# Patient Record
Sex: Female | Born: 1960 | Race: Black or African American | Hispanic: No
Health system: Southern US, Community
[De-identification: ages and names within clinical notes are randomized; demographics above are authoritative.]

## PROBLEM LIST (undated history)

## (undated) DIAGNOSIS — I10 Essential (primary) hypertension: Secondary | ICD-10-CM

## (undated) DIAGNOSIS — J45909 Unspecified asthma, uncomplicated: Secondary | ICD-10-CM

## (undated) HISTORY — PX: HERNIA REPAIR: SHX51

---

## 1998-06-23 ENCOUNTER — Emergency Department (HOSPITAL_COMMUNITY): Admission: EM | Admit: 1998-06-23 | Discharge: 1998-06-23 | Payer: Self-pay | Admitting: Emergency Medicine

## 1999-01-18 ENCOUNTER — Emergency Department (HOSPITAL_COMMUNITY): Admission: EM | Admit: 1999-01-18 | Discharge: 1999-01-18 | Payer: Self-pay | Admitting: Emergency Medicine

## 1999-07-02 ENCOUNTER — Inpatient Hospital Stay (HOSPITAL_COMMUNITY): Admission: EM | Admit: 1999-07-02 | Discharge: 1999-07-11 | Payer: Self-pay | Admitting: Emergency Medicine

## 1999-07-02 ENCOUNTER — Encounter: Payer: Self-pay | Admitting: Emergency Medicine

## 1999-07-22 ENCOUNTER — Other Ambulatory Visit: Admission: RE | Admit: 1999-07-22 | Discharge: 1999-07-22 | Payer: Self-pay | Admitting: Obstetrics and Gynecology

## 1999-09-22 ENCOUNTER — Encounter (INDEPENDENT_AMBULATORY_CARE_PROVIDER_SITE_OTHER): Payer: Self-pay | Admitting: *Deleted

## 1999-09-22 ENCOUNTER — Ambulatory Visit (HOSPITAL_BASED_OUTPATIENT_CLINIC_OR_DEPARTMENT_OTHER): Admission: RE | Admit: 1999-09-22 | Discharge: 1999-09-22 | Payer: Self-pay | Admitting: General Surgery

## 1999-11-02 ENCOUNTER — Encounter: Admission: RE | Admit: 1999-11-02 | Discharge: 1999-11-02 | Payer: Self-pay | Admitting: Obstetrics and Gynecology

## 1999-11-02 ENCOUNTER — Encounter: Payer: Self-pay | Admitting: Obstetrics and Gynecology

## 2000-03-20 ENCOUNTER — Other Ambulatory Visit: Admission: RE | Admit: 2000-03-20 | Discharge: 2000-03-20 | Payer: Self-pay | Admitting: Obstetrics and Gynecology

## 2000-03-20 ENCOUNTER — Encounter (INDEPENDENT_AMBULATORY_CARE_PROVIDER_SITE_OTHER): Payer: Self-pay | Admitting: Specialist

## 2001-04-10 ENCOUNTER — Encounter: Payer: Self-pay | Admitting: Emergency Medicine

## 2001-04-10 ENCOUNTER — Emergency Department (HOSPITAL_COMMUNITY): Admission: EM | Admit: 2001-04-10 | Discharge: 2001-04-10 | Payer: Self-pay | Admitting: Emergency Medicine

## 2001-10-18 ENCOUNTER — Emergency Department (HOSPITAL_COMMUNITY): Admission: EM | Admit: 2001-10-18 | Discharge: 2001-10-18 | Payer: Self-pay | Admitting: Emergency Medicine

## 2001-10-18 ENCOUNTER — Encounter: Payer: Self-pay | Admitting: Emergency Medicine

## 2002-05-06 ENCOUNTER — Encounter: Payer: Self-pay | Admitting: Emergency Medicine

## 2002-05-06 ENCOUNTER — Emergency Department (HOSPITAL_COMMUNITY): Admission: EM | Admit: 2002-05-06 | Discharge: 2002-05-06 | Payer: Self-pay | Admitting: Emergency Medicine

## 2004-09-06 ENCOUNTER — Ambulatory Visit: Payer: Self-pay | Admitting: Family Medicine

## 2004-09-20 ENCOUNTER — Ambulatory Visit: Payer: Self-pay | Admitting: Family Medicine

## 2004-09-20 ENCOUNTER — Ambulatory Visit: Payer: Self-pay | Admitting: *Deleted

## 2004-10-24 ENCOUNTER — Ambulatory Visit: Payer: Self-pay | Admitting: Family Medicine

## 2004-11-22 ENCOUNTER — Ambulatory Visit: Payer: Self-pay | Admitting: Family Medicine

## 2004-11-30 ENCOUNTER — Encounter: Admission: RE | Admit: 2004-11-30 | Discharge: 2004-11-30 | Payer: Self-pay | Admitting: Family Medicine

## 2005-03-06 ENCOUNTER — Ambulatory Visit: Payer: Self-pay | Admitting: Nurse Practitioner

## 2005-03-08 ENCOUNTER — Ambulatory Visit (HOSPITAL_COMMUNITY): Admission: RE | Admit: 2005-03-08 | Discharge: 2005-03-08 | Payer: Self-pay | Admitting: Internal Medicine

## 2005-03-31 ENCOUNTER — Ambulatory Visit: Payer: Self-pay | Admitting: Family Medicine

## 2005-04-26 ENCOUNTER — Ambulatory Visit: Payer: Self-pay | Admitting: Family Medicine

## 2006-01-22 ENCOUNTER — Ambulatory Visit: Payer: Self-pay | Admitting: Family Medicine

## 2006-06-19 ENCOUNTER — Encounter: Admission: RE | Admit: 2006-06-19 | Discharge: 2006-06-19 | Payer: Self-pay | Admitting: Obstetrics and Gynecology

## 2006-08-09 ENCOUNTER — Encounter: Admission: RE | Admit: 2006-08-09 | Discharge: 2006-08-09 | Payer: Self-pay | Admitting: Interventional Radiology

## 2006-10-16 ENCOUNTER — Ambulatory Visit (HOSPITAL_COMMUNITY): Admission: RE | Admit: 2006-10-16 | Discharge: 2006-10-16 | Payer: Self-pay | Admitting: Interventional Radiology

## 2006-10-18 ENCOUNTER — Ambulatory Visit (HOSPITAL_COMMUNITY): Admission: RE | Admit: 2006-10-18 | Discharge: 2006-10-19 | Payer: Self-pay | Admitting: Interventional Radiology

## 2006-10-23 ENCOUNTER — Encounter: Admission: RE | Admit: 2006-10-23 | Discharge: 2006-10-23 | Payer: Self-pay | Admitting: Interventional Radiology

## 2007-05-15 ENCOUNTER — Encounter: Admission: RE | Admit: 2007-05-15 | Discharge: 2007-05-15 | Payer: Self-pay | Admitting: Internal Medicine

## 2007-06-30 ENCOUNTER — Emergency Department (HOSPITAL_COMMUNITY): Admission: EM | Admit: 2007-06-30 | Discharge: 2007-06-30 | Payer: Self-pay | Admitting: Emergency Medicine

## 2007-07-18 ENCOUNTER — Ambulatory Visit (HOSPITAL_BASED_OUTPATIENT_CLINIC_OR_DEPARTMENT_OTHER): Admission: RE | Admit: 2007-07-18 | Discharge: 2007-07-18 | Payer: Self-pay | Admitting: General Surgery

## 2007-07-18 ENCOUNTER — Encounter (INDEPENDENT_AMBULATORY_CARE_PROVIDER_SITE_OTHER): Payer: Self-pay | Admitting: General Surgery

## 2009-02-16 ENCOUNTER — Encounter: Admission: RE | Admit: 2009-02-16 | Discharge: 2009-02-24 | Payer: Self-pay | Admitting: Internal Medicine

## 2010-10-15 ENCOUNTER — Encounter: Payer: Self-pay | Admitting: Interventional Radiology

## 2010-10-16 ENCOUNTER — Encounter: Payer: Self-pay | Admitting: Interventional Radiology

## 2010-10-16 ENCOUNTER — Encounter: Payer: Self-pay | Admitting: Internal Medicine

## 2010-10-17 ENCOUNTER — Encounter: Payer: Self-pay | Admitting: Internal Medicine

## 2010-11-27 ENCOUNTER — Ambulatory Visit (INDEPENDENT_AMBULATORY_CARE_PROVIDER_SITE_OTHER): Payer: Self-pay | Admitting: Family Medicine

## 2010-11-27 ENCOUNTER — Ambulatory Visit
Admission: RE | Admit: 2010-11-27 | Discharge: 2010-11-27 | Disposition: A | Discharge status: Home / Self Care | Payer: Self-pay | Source: Ambulatory Visit | Attending: Family Medicine | Admitting: Family Medicine

## 2010-11-27 ENCOUNTER — Other Ambulatory Visit: Payer: Self-pay | Admitting: Family Medicine

## 2010-11-27 ENCOUNTER — Encounter: Payer: Self-pay | Admitting: Family Medicine

## 2010-11-27 DIAGNOSIS — M79609 Pain in unspecified limb: Secondary | ICD-10-CM

## 2010-11-27 DIAGNOSIS — M7750 Other enthesopathy of unspecified foot: Secondary | ICD-10-CM

## 2010-11-27 DIAGNOSIS — M79672 Pain in left foot: Secondary | ICD-10-CM

## 2010-12-03 ENCOUNTER — Telehealth (INDEPENDENT_AMBULATORY_CARE_PROVIDER_SITE_OTHER): Payer: Self-pay

## 2010-12-06 NOTE — Progress Notes (Signed)
  Phone Note Outgoing Call   Call placed by: Linton Flemings RN,  December 03, 2010 4:07 PM Call placed to: Patient Summary of Call: called, no answer unable to leave message

## 2010-12-06 NOTE — Assessment & Plan Note (Signed)
Summary: foot/tmrm3)   Vital Signs:  Patient Profile:   50 Years Old Female CC:      foot problem Height:     65 inches Weight:      233 pounds O2 Sat:      97 % O2 treatment:    Room Air Temp:     97.7 degrees F oral Pulse rate:   77 / minute Resp:     20 per minute BP sitting:   137 / 86  (left arm) Cuff size:   regular  Vitals Entered By: Linton Flemings RN (November 27, 2010 12:06 PM)                  Updated Prior Medication List: No Medications Current Allergies: No known allergies History of Present Illness Chief Complaint: foot problem History of Present Illness:  Subjective:  Patient complains of onset of pain in her left lateral foot over the 5th toe about two weeks ago, worse with ambulation.  She recalls no injury.  No recent change in shoes.  No recent change in activities.  REVIEW OF SYSTEMS Constitutional Symptoms      Denies fever, chills, night sweats, weight loss, weight gain, and fatigue.  Eyes       Denies change in vision, eye pain, eye discharge, glasses, contact lenses, and eye surgery. Ear/Nose/Throat/Mouth       Denies hearing loss/aids, change in hearing, ear pain, ear discharge, dizziness, frequent runny nose, frequent nose bleeds, sinus problems, sore throat, hoarseness, and tooth pain or bleeding.  Respiratory       Denies dry cough, productive cough, wheezing, shortness of breath, asthma, bronchitis, and emphysema/COPD.  Cardiovascular       Denies murmurs, chest pain, and tires easily with exhertion.    Gastrointestinal       Denies stomach pain, nausea/vomiting, diarrhea, constipation, blood in bowel movements, and indigestion. Genitourniary       Denies painful urination, kidney stones, and loss of urinary control. Neurological       Denies paralysis, seizures, and fainting/blackouts. Musculoskeletal       Complains of muscle pain and joint stiffness.      Denies decreased range of motion, redness, swelling, muscle weakness, and  gout.  Skin       Denies bruising, unusual mles/lumps or sores, and hair/skin or nail changes.  Psych       Denies mood changes, temper/anger issues, anxiety/stress, speech problems, depression, and sleep problems. Other Comments: pain to left outter side of foot x2 week; unknown injury   Past History:  Past Medical History: Unremarkable  Past Surgical History: hernia repair fibroids removed  Family History: Family History Lung cancer Family History of Prostate CA 1st degree relative <50  Social History: no alcohol  non smoker no drugs   Objective:  No acute distress  Left foot:  No swelling, erythema, or warmth.  There is mild tenderness over the 5 MTP joint.  Note presence of bunion.  Distal neurovascular intact  X-ray left foot:  IMPRESSION: No acute osseous abnormality.  Hallux valgus and bunion. Assessment New Problems: METATARSALGIA (ICD-726.70) FOOT PAIN, LEFT (ICD-729.5)   Plan New Orders: T-DG Foot Complete*L* [73630] New Patient Level III [16109] Planning Comments:   Recommend well-fitting shoes with arch supports. Begin NSAID.  Consider follow-up with podiatrist.  ([facility] information and instruction patient handout given on metatarsalgia)   The patient and/or caregiver has been counseled thoroughly with regard to medications prescribed including dosage, schedule, interactions,  rationale for use, and possible side effects and they verbalize understanding.  Diagnoses and expected course of recovery discussed and will return if not improved as expected or if the condition worsens. Patient and/or caregiver verbalized understanding.   Orders Added: 1)  T-DG Foot Complete*L* [73630] 2)  New Patient Level III [54098]

## 2010-12-18 ENCOUNTER — Emergency Department (HOSPITAL_COMMUNITY)
Admission: EM | Admit: 2010-12-18 | Discharge: 2010-12-18 | Disposition: A | Discharge status: Home / Self Care | Payer: Self-pay | Attending: Emergency Medicine | Admitting: Emergency Medicine

## 2010-12-18 ENCOUNTER — Emergency Department (HOSPITAL_COMMUNITY): Payer: Self-pay

## 2010-12-18 DIAGNOSIS — J45909 Unspecified asthma, uncomplicated: Secondary | ICD-10-CM | POA: Insufficient documentation

## 2010-12-18 DIAGNOSIS — H5789 Other specified disorders of eye and adnexa: Secondary | ICD-10-CM | POA: Insufficient documentation

## 2010-12-18 DIAGNOSIS — Y929 Unspecified place or not applicable: Secondary | ICD-10-CM | POA: Insufficient documentation

## 2010-12-18 DIAGNOSIS — H5710 Ocular pain, unspecified eye: Secondary | ICD-10-CM | POA: Insufficient documentation

## 2010-12-18 DIAGNOSIS — H1130 Conjunctival hemorrhage, unspecified eye: Secondary | ICD-10-CM | POA: Insufficient documentation

## 2010-12-18 DIAGNOSIS — S0010XA Contusion of unspecified eyelid and periocular area, initial encounter: Secondary | ICD-10-CM | POA: Insufficient documentation

## 2011-02-07 NOTE — Op Note (Signed)
[name], Jessica White                  ACCOUNT NO.:  [id number]   MEDICAL RECORD NO.:  [id number]          PATIENT TYPE:  AMB   LOCATION:  DSC                          FACILITY:  [facility]   PHYSICIAN:  Cherylynn Ridges, M.D.    DATE OF BIRTH:  [birth year]   DATE OF PROCEDURE:  07/18/2007  DATE OF DISCHARGE:                               OPERATIVE REPORT   PREOPERATIVE DIAGNOSIS:  Right breast intraductal papilloma with  draining nipple.   POSTOPERATIVE DIAGNOSIS:  Right breast intraductal papilloma with  draining nipple with a large subareolar cystic mass.   PROCEDURE:  1. Right breast ductal exploration.  2. Excision of a right subareolar ductal system with cystic mass.  3. Removal of papilloma.   SURGEON:  Cherylynn Ridges, M.D.   ASSISTANT:  None.   ANESTHESIA:  General with a laryngeal airway.   ESTIMATED BLOOD LOSS:  Less than 50 mL.   COMPLICATIONS:  None.   CONDITION:  Stable.   INDICATIONS FOR OPERATION:  The patient has a 50 year old with a  draining nipple on the right breast and a preoperative study outside  demonstrating an intraductal papilloma, who comes in for excision.   FINDINGS:  The patient had a large subareolar ductal system which was  dilated possibly because of partial obstruction secondary to the  intraductal mass with an intraductal papilloma noted at the distal  aspect of the duct on the right side.   OPERATION:  The patient was taken to the operating room and placed on  the table in a supine position.  After an adequate general laryngeal  airway anesthetic was administered, she was prepped and draped in the  usual sterile manner, exposing the right breast.   We cannulated the draining duct with a lacrimal probe.  We left that in  place as we made a curvilinear incision in the superolateral aspect of  the right areola.   We dissected down into the subcutaneous tissue into the subareolar area  using Metzenbaum scissors, the electrocautery and hemostat  clamps.  Using this technique, we were able to dissect out multiple subareolar  cystic masses and also dissect into the ductal system, demonstrating a  subareolar intraductal papilloma.   We excised the ductal mass and the intraductal papilloma in toto with  the nipple complex.  We opened the specimen, once we had gotten it off  the table and it demonstrated the papilloma, which was sent separately;  it was very friable and it attached itself somewhat from the ductal  system itself.  We sent the cystic mass separately and also after we had  resected the cystic mass and the intraductal papilloma, there appeared  to be nodularity underneath the nipple itself and we resected that,  which may have represented only connective tissue.   After all the masses were removed, we obtained hemostasis with  electrocautery.  We closed in 3 layers, a deep 3-0 Vicryl layer, a more  superficial subcutaneous 3-0 Vicryl layer and then the skin was closed  using a running subcuticular stitch of 4-0 Monocryl.  Marcaine 0.5%  without epinephrine was injected into the wound.  The nipple was  slightly inverted medially and inferiorly after the closure; however, we  were able to apply a sterile dressing including Dermabond, Steri-Strips  and Tegaderm.  All counts were correct.      Cherylynn Ridges, M.D.  Electronically Signed     JOW/MEDQ  D:  07/18/2007  T:  07/19/2007  Job:  [id number]

## 2011-02-10 NOTE — Op Note (Signed)
[facility]  Patient:    Jessica White                          MRN: [id number] Proc. Date: 09/22/99 Adm. Date:  [id number] Attending:  Fortino Sic                           Operative Report  PREOPERATIVE DIAGNOSIS:  Incarcerated epigastric hernia.  POSTOPERATIVE DIAGNOSIS:  Incarcerated epigastric hernia.  OPERATION PERFORMED:  Repair of incarcerated epigastric hernia.  SURGEON:  Marnee Spring. Wiliam Ke, M.D.  ASSISTANT:  None.  ANESTHESIA:  Endotracheal by hospital.  DESCRIPTION OF PROCEDURE:  Under good general anesthesia, the skin of the abdomen was prepped and draped in the usual manner.  The tender nodule was marked approximately 4.5 cm below the costal cartilages in the midline.  A vertical incision was made and deepened in subcutaneous tissue.  The defect in the fascia was found.  The incarcerated preperitoneal fat was clamped off and tied up with  2-0 silk.  The defect in the fascia was then closed with through-and-through musculofascial suture of #1 Novofil.  Hemostasis was good.  The rest of the anterior abdominal wall in the area was explored visually and digitally, and no  other defects were found.  The wound was closed with subcutaneous 3-0 Vicryl and subcuticular 4-0 Dexon.  Steri-Strips were applied.  Estimated blood loss minimal.  The patient received no blood and left the operating room in satisfactory condition after sponge and needle counts were verified. DD:  09/22/99 TD:  09/23/99 Job: [id number]

## 2011-07-05 LAB — POCT HEMOGLOBIN-HEMACUE: Hemoglobin: 11.7 — ABNORMAL LOW

## 2011-11-02 ENCOUNTER — Other Ambulatory Visit (HOSPITAL_COMMUNITY): Payer: Self-pay | Admitting: Nurse Practitioner

## 2011-11-02 ENCOUNTER — Ambulatory Visit (HOSPITAL_COMMUNITY)
Admission: RE | Admit: 2011-11-02 | Discharge: 2011-11-02 | Disposition: A | Discharge status: Home / Self Care | Payer: Self-pay | Source: Ambulatory Visit | Attending: Nurse Practitioner | Admitting: Nurse Practitioner

## 2011-11-02 DIAGNOSIS — IMO0002 Reserved for concepts with insufficient information to code with codable children: Secondary | ICD-10-CM | POA: Insufficient documentation

## 2011-11-02 DIAGNOSIS — R52 Pain, unspecified: Secondary | ICD-10-CM

## 2011-11-02 DIAGNOSIS — M25569 Pain in unspecified knee: Secondary | ICD-10-CM | POA: Insufficient documentation

## 2011-11-02 DIAGNOSIS — M1710 Unilateral primary osteoarthritis, unspecified knee: Secondary | ICD-10-CM | POA: Insufficient documentation

## 2013-03-15 IMAGING — CR DG KNEE 1-2V*L*
2 series · 2 of 2 positions shown · non-contrast
Comparison: None.

CLINICAL DATA: Knee pain and popping.

LEFT KNEE - 1-2 VIEW

[t knee ap left]
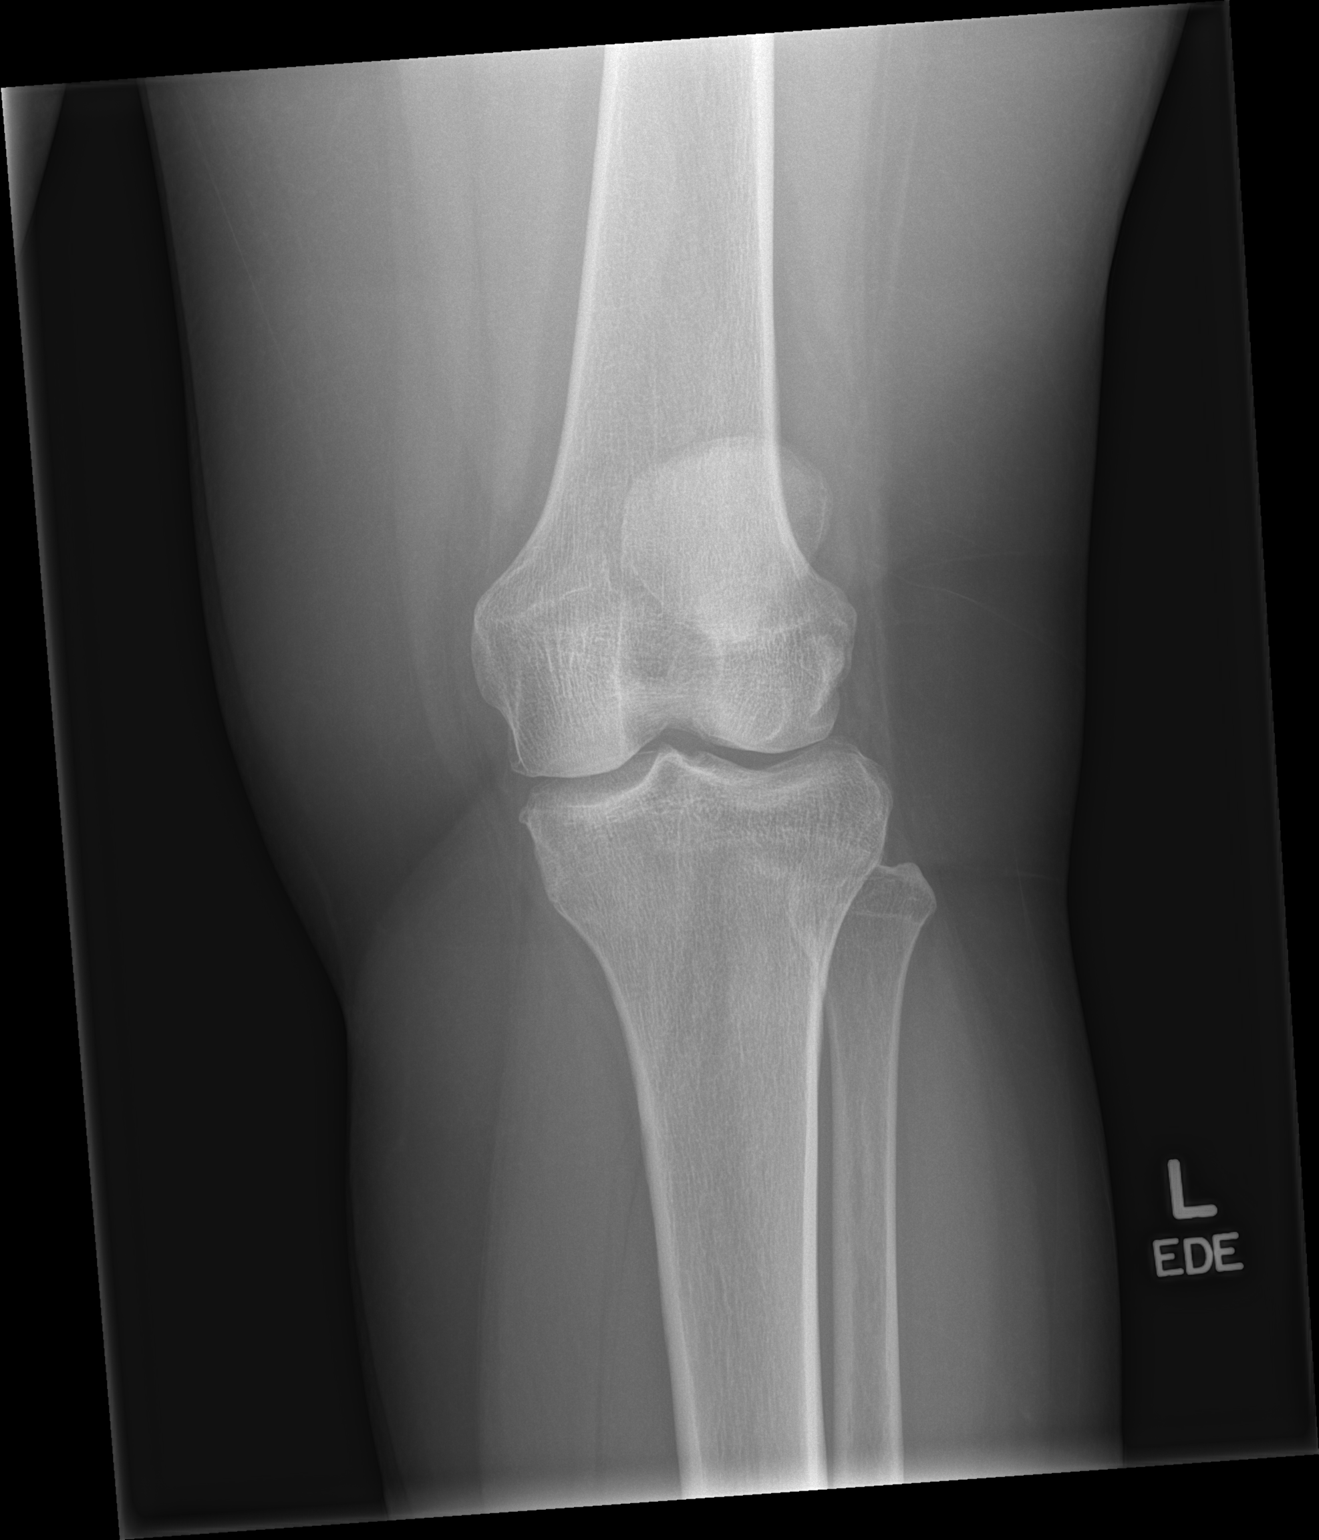

[t knee lat left]
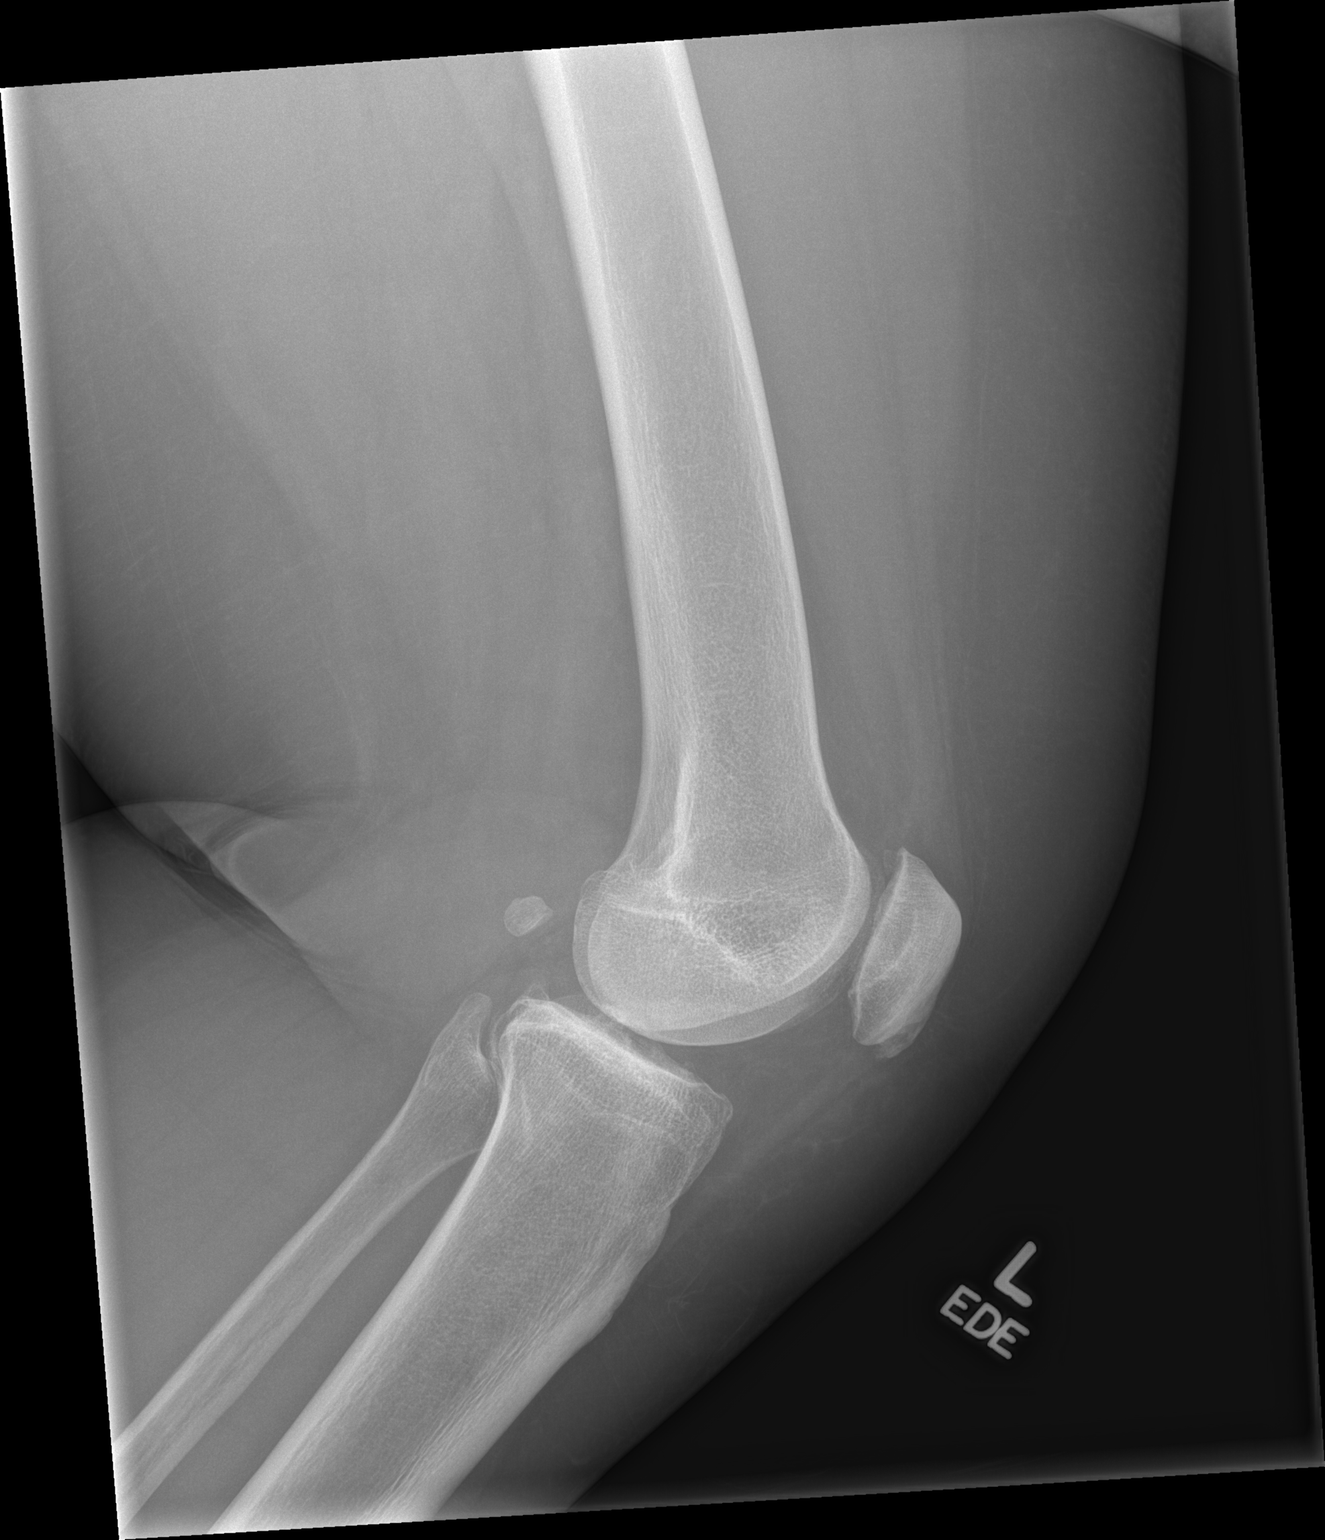

[2 of 2 positions shown; findings below may reference images not displayed]

FINDINGS: No acute bony or joint abnormality is identified.  No
joint effusion.  Small osteophytes about the knee are noted.
IMPRESSION: No acute finding.  Mild appearing degenerative disease.

## 2017-10-20 ENCOUNTER — Encounter (HOSPITAL_BASED_OUTPATIENT_CLINIC_OR_DEPARTMENT_OTHER): Payer: Self-pay | Admitting: *Deleted

## 2017-10-20 ENCOUNTER — Other Ambulatory Visit: Payer: Self-pay

## 2017-10-20 ENCOUNTER — Emergency Department (HOSPITAL_BASED_OUTPATIENT_CLINIC_OR_DEPARTMENT_OTHER)
Admission: EM | Admit: 2017-10-20 | Discharge: 2017-10-21 | Disposition: A | Discharge status: Home / Self Care | Payer: BLUE CROSS/BLUE SHIELD | Attending: Emergency Medicine | Admitting: Emergency Medicine

## 2017-10-20 DIAGNOSIS — R070 Pain in throat: Secondary | ICD-10-CM | POA: Insufficient documentation

## 2017-10-20 DIAGNOSIS — R05 Cough: Secondary | ICD-10-CM | POA: Insufficient documentation

## 2017-10-20 DIAGNOSIS — R067 Sneezing: Secondary | ICD-10-CM | POA: Diagnosis not present

## 2017-10-20 DIAGNOSIS — H9203 Otalgia, bilateral: Secondary | ICD-10-CM | POA: Insufficient documentation

## 2017-10-20 DIAGNOSIS — R0982 Postnasal drip: Secondary | ICD-10-CM | POA: Insufficient documentation

## 2017-10-20 DIAGNOSIS — J069 Acute upper respiratory infection, unspecified: Secondary | ICD-10-CM

## 2017-10-20 DIAGNOSIS — R030 Elevated blood-pressure reading, without diagnosis of hypertension: Secondary | ICD-10-CM | POA: Diagnosis not present

## 2017-10-20 DIAGNOSIS — J45909 Unspecified asthma, uncomplicated: Secondary | ICD-10-CM | POA: Insufficient documentation

## 2017-10-20 DIAGNOSIS — R0981 Nasal congestion: Secondary | ICD-10-CM | POA: Diagnosis present

## 2017-10-20 HISTORY — DX: Unspecified asthma, uncomplicated: J45.909

## 2017-10-20 MED ORDER — ACETAMINOPHEN 325 MG PO TABS: 650.0000 mg | ORAL_TABLET | Freq: Four times a day (QID) | ORAL | 0 refills | Status: DC | PRN

## 2017-10-20 MED ORDER — AEROCHAMBER PLUS FLO-VU LARGE MISC
1.0000 | Freq: Once | Status: AC
  Administered 2017-10-20: 1
  Filled 2017-10-20: qty 1

## 2017-10-20 MED ORDER — CETIRIZINE HCL 10 MG PO TABS: 10.0000 mg | ORAL_TABLET | Freq: Every day | ORAL | 1 refills | Status: DC

## 2017-10-20 MED ORDER — FLUTICASONE PROPIONATE 50 MCG/ACT NA SUSP: 2.0000 | Freq: Every day | NASAL | 0 refills | Status: DC

## 2017-10-20 MED ORDER — BENZONATATE 100 MG PO CAPS: 100.0000 mg | ORAL_CAPSULE | Freq: Three times a day (TID) | ORAL | 0 refills | Status: DC | PRN

## 2017-10-20 MED ORDER — ALBUTEROL SULFATE HFA 108 (90 BASE) MCG/ACT IN AERS
2.0000 | INHALATION_SPRAY | Freq: Once | RESPIRATORY_TRACT | Status: AC
  Administered 2017-10-20: 2 via RESPIRATORY_TRACT
  Filled 2017-10-20: qty 6.7

## 2017-10-20 NOTE — ED Notes (Signed)
Pt given d/c instructions as per chart. Rx x 4. Verbalizes understanding. No questions. 

## 2017-10-20 NOTE — ED Provider Notes (Signed)
[facility] EMERGENCY DEPARTMENT Provider Note   CSN: [id number] Arrival date & time: 10/20/17  1926     History   Chief Complaint Chief Complaint  Patient presents with  . Nasal Congestion    HPI Jessica White is a 57 y.o. female.  Jessica White is a 57 y.o. Female who presents to the emergency department complaining of URI symptoms for the past 3 days.  Patient reports sneezing, nasal congestion, postnasal drip, sore throat, cough and bilateral ear pressure.  She has had no fevers.  No shortness of breath.  She does reports she has asthma and sometimes feels tightness in her chest.  None currently.  She does not have an albuterol inhaler.  She took an unknown allergy pill today with out relief.  She denies fevers, trouble swallowing, hearing loss, facial swelling, rashes, neck stiffness, chest pain or shortness of breath.   The history is provided by the patient and medical records. No language interpreter was used.    Past Medical History:  Diagnosis Date  . Asthma     Patient Active Problem List   Diagnosis Date Noted  . METATARSALGIA 11/27/2010    Past Surgical History:  Procedure Laterality Date  . HERNIA REPAIR      OB History    No data available       Home Medications    Prior to Admission medications   Medication Sig Start Date End Date Taking? Authorizing Provider  acetaminophen (TYLENOL) 325 MG tablet Take 2 tablets (650 mg total) by mouth every 6 (six) hours as needed for mild pain, moderate pain, fever or headache. 10/20/17   Everlene Farrier, PA-C  benzonatate (TESSALON) 100 MG capsule Take 1 capsule (100 mg total) by mouth 3 (three) times daily as needed for cough. 10/20/17   Everlene Farrier, PA-C  cetirizine (ZYRTEC ALLERGY) 10 MG tablet Take 1 tablet (10 mg total) by mouth daily. 10/20/17   Everlene Farrier, PA-C  fluticasone (FLONASE) 50 MCG/ACT nasal spray Place 2 sprays into both nostrils daily. 10/20/17   Everlene Farrier, PA-C    Family  History No family history on file.  Social History Social History   Tobacco Use  . Smoking status: Never Smoker  . Smokeless tobacco: Never Used  Substance Use Topics  . Alcohol use: No    Frequency: Never  . Drug use: No     Allergies   Patient has no known allergies.   Review of Systems Review of Systems  Constitutional: Negative for chills and fever.  HENT: Positive for congestion, ear pain, postnasal drip, rhinorrhea, sinus pain, sneezing and sore throat. Negative for ear discharge, facial swelling, nosebleeds and trouble swallowing.   Eyes: Negative for visual disturbance.  Respiratory: Positive for cough. Negative for shortness of breath.   Cardiovascular: Negative for chest pain.  Gastrointestinal: Negative for abdominal pain and vomiting.  Musculoskeletal: Negative for neck pain and neck stiffness.  Skin: Negative for wound.  Neurological: Negative for syncope, light-headedness and headaches.     Physical Exam Updated Vital Signs BP (!) 159/99 (BP Location: Left Arm)   Pulse 83   Temp 97.9 F (36.6 C) (Oral)   Resp 18   Ht 5\' 5"  (1.651 m)   Wt 116.7 kg (257 lb 3.2 oz)   SpO2 100%   BMI 42.80 kg/m   Physical Exam  Constitutional: She appears well-developed and well-nourished. No distress.  Nontoxic-appearing.  HENT:  Head: Normocephalic and atraumatic.  Right Ear: External ear normal.  Left Ear: External ear normal.  Mouth/Throat: Oropharynx is clear and moist. No oropharyngeal exudate.  Rhinorrhea and boggy nasal turbinates bilaterally. Mild clear middle ear effusion bilaterally without TM erythema or loss of landmarks. Throat is clear.  Eyes: Conjunctivae are normal. Pupils are equal, round, and reactive to light. Right eye exhibits no discharge. Left eye exhibits no discharge.  Neck: Normal range of motion. Neck supple.  Cardiovascular: Normal rate, regular rhythm, normal heart sounds and intact distal pulses.  Pulmonary/Chest: Effort normal and  breath sounds normal. No stridor. No respiratory distress. She has no wheezes. She has no rales.  Lungs are clear to ascultation bilaterally. Symmetric chest expansion bilaterally. No increased work of breathing. No rales or rhonchi.    Abdominal: Soft. There is no tenderness.  Musculoskeletal: She exhibits no edema.  Lymphadenopathy:    She has no cervical adenopathy.  Neurological: She is alert. Coordination normal.  Skin: Skin is warm and dry. No rash noted. She is not diaphoretic. No erythema. No pallor.  Psychiatric: She has a normal mood and affect. Her behavior is normal.  Nursing note and vitals reviewed.    ED Treatments / Results  Labs (all labs ordered are listed, but only abnormal results are displayed) Labs Reviewed - No data to display  EKG  EKG Interpretation None       Radiology No results found.  Procedures Procedures (including critical care time)  Medications Ordered in ED Medications  albuterol (PROVENTIL HFA;VENTOLIN HFA) 108 (90 Base) MCG/ACT inhaler 2 puff (not administered)  AEROCHAMBER PLUS FLO-VU LARGE MISC 1 each (not administered)     Initial Impression / Assessment and Plan / ED Course  I have reviewed the triage vital signs and the nursing notes.  Pertinent labs & imaging results that were available during my care of the patient were reviewed by me and considered in my medical decision making (see chart for details).    This is a 57 y.o. Female who presents to the emergency department complaining of URI symptoms for the past 3 days.  Patient reports sneezing, nasal congestion, postnasal drip, sore throat, cough and bilateral ear pressure.  She has had no fevers.  No shortness of breath.  She does reports she has asthma and sometimes feels tightness in her chest.  None currently.  She does not have an albuterol inhaler.  On exam the patient is afebrile nontoxic-appearing.  Lungs are clear to auscultation bilaterally.  She is not hypoxic or  tachypneic.  Exam is consistent with a URI.  Will discharge with prescriptions for Flonase, Zyrtec, Tylenol and Tessalon Perles.  Patient also provided with albuterol inhaler as she is out of hers.  No wheezing noted on exam.  Blood pressure noted to be elevated.  Patient advised to follow-up with primary care to have blood pressure rechecked. I advised the patient to follow-up with their primary care provider this week. I advised the patient to return to the emergency department with new or worsening symptoms or new concerns. The patient verbalized understanding and agreement with plan.     Final Clinical Impressions(s) / ED Diagnoses   Final diagnoses:  Viral upper respiratory tract infection  Elevated blood pressure reading    ED Discharge Orders        Ordered    fluticasone (FLONASE) 50 MCG/ACT nasal spray  Daily     10/20/17 2333    cetirizine (ZYRTEC ALLERGY) 10 MG tablet  Daily     10/20/17 2333    acetaminophen (  TYLENOL) 325 MG tablet  Every 6 hours PRN     10/20/17 2333    benzonatate (TESSALON) 100 MG capsule  3 times daily PRN     10/20/17 2333       Everlene FarrierDansie, Sarai January, PA-C 10/20/17 2339    Azalia Bilisampos, Kevin, MD 10/21/17 [phone]

## 2017-10-20 NOTE — Discharge Instructions (Signed)
Please have your blood pressure rechecked by your doctor, it was high today at 159/99.

## 2017-10-20 NOTE — ED Notes (Signed)
O2 100%, HR 75, resp 22; NAD

## 2017-10-20 NOTE — ED Notes (Signed)
Pt states she has been congested since Thursday. Yellow nasal drainage. Productive cough with yellow sputum. BBS-Slightly diminished on left. States she also has asthma and feels tight in her chest at times.

## 2017-10-20 NOTE — ED Triage Notes (Signed)
Pt reports congestion and dark yellow drainage from nose x 2 days. Hx of asthma.

## 2017-10-20 NOTE — ED Notes (Signed)
ED Provider at bedside talking with pt 

## 2020-01-08 ENCOUNTER — Ambulatory Visit: Payer: Self-pay

## 2020-01-08 ENCOUNTER — Ambulatory Visit: Payer: Self-pay | Attending: Internal Medicine

## 2020-01-08 DIAGNOSIS — Z23 Encounter for immunization: Secondary | ICD-10-CM

## 2020-01-08 NOTE — Progress Notes (Signed)
   Covid-19 Vaccination Clinic  Name:  Jessica White    MRN: [id number] DOB: June 22, 1961  01/08/2020  Jessica White was observed post Covid-19 immunization for 15 minutes without incident. She was provided with Vaccine Information Sheet and instruction to access the V-Safe system.   Jessica White was instructed to call 911 with any severe reactions post vaccine: Marland Kitchen Difficulty breathing  . Swelling of face and throat  . A fast heartbeat  . A bad rash all over body  . Dizziness and weakness   Immunizations Administered    Name Date Dose VIS Date Route   Pfizer COVID-19 Vaccine 01/08/2020  8:28 AM 0.3 mL 09/05/2019 Intramuscular   Manufacturer: [employer], [employer]   Lot: [id number]   NDC: [address]-7

## 2020-02-03 ENCOUNTER — Ambulatory Visit: Payer: Self-pay | Attending: Internal Medicine

## 2020-02-03 DIAGNOSIS — Z23 Encounter for immunization: Secondary | ICD-10-CM

## 2020-02-03 NOTE — Progress Notes (Signed)
   Covid-19 Vaccination Clinic  Name:  Jessica White    MRN: [id number] DOB: [birth year]  02/03/2020  Jessica White was observed post Covid-19 immunization for 15 minutes without incident. She was provided with Vaccine Information Sheet and instruction to access the V-Safe system.   Jessica White was instructed to call 911 with any severe reactions post vaccine: Marland Kitchen Difficulty breathing  . Swelling of face and throat  . A fast heartbeat  . A bad rash all over body  . Dizziness and weakness   Immunizations Administered    Name Date Dose VIS Date Route   Pfizer COVID-19 Vaccine 02/03/2020  9:06 AM 0.3 mL 11/19/2018 Intramuscular   Manufacturer: [employer], [employer]   Lot: [id number]   NDC: [address]-8

## 2021-07-30 ENCOUNTER — Emergency Department (HOSPITAL_BASED_OUTPATIENT_CLINIC_OR_DEPARTMENT_OTHER)
Admission: EM | Admit: 2021-07-30 | Discharge: 2021-07-30 | Disposition: A | Discharge status: Home / Self Care | Payer: Self-pay | Attending: Emergency Medicine | Admitting: Emergency Medicine

## 2021-07-30 ENCOUNTER — Other Ambulatory Visit: Payer: Self-pay

## 2021-07-30 ENCOUNTER — Emergency Department (HOSPITAL_BASED_OUTPATIENT_CLINIC_OR_DEPARTMENT_OTHER): Payer: Self-pay

## 2021-07-30 ENCOUNTER — Encounter (HOSPITAL_BASED_OUTPATIENT_CLINIC_OR_DEPARTMENT_OTHER): Payer: Self-pay | Admitting: *Deleted

## 2021-07-30 DIAGNOSIS — M25512 Pain in left shoulder: Secondary | ICD-10-CM | POA: Insufficient documentation

## 2021-07-30 DIAGNOSIS — J45909 Unspecified asthma, uncomplicated: Secondary | ICD-10-CM | POA: Insufficient documentation

## 2021-07-30 LAB — CBC
HCT: 38.3 % (ref 36.0–46.0)
Hemoglobin: 12 g/dL (ref 12.0–15.0)
MCH: 25.9 pg — ABNORMAL LOW (ref 26.0–34.0)
MCHC: 31.3 g/dL (ref 30.0–36.0)
MCV: 82.5 fL (ref 80.0–100.0)
Platelets: 269 10*3/uL (ref 150–400)
RBC: 4.64 MIL/uL (ref 3.87–5.11)
RDW: 15.3 % (ref 11.5–15.5)
WBC: 7.5 10*3/uL (ref 4.0–10.5)
nRBC: 0 % (ref 0.0–0.2)

## 2021-07-30 LAB — BASIC METABOLIC PANEL
Anion gap: 8 (ref 5–15)
BUN: 24 mg/dL — ABNORMAL HIGH (ref 6–20)
CO2: 26 mmol/L (ref 22–32)
Calcium: 9.5 mg/dL (ref 8.9–10.3)
Chloride: 109 mmol/L (ref 98–111)
Creatinine, Ser: 1.08 mg/dL — ABNORMAL HIGH (ref 0.44–1.00)
GFR, Estimated: 59 mL/min — ABNORMAL LOW (ref >60)
Glucose, Bld: 84 mg/dL (ref 70–99)
Potassium: 3.7 mmol/L (ref 3.5–5.1)
Sodium: 143 mmol/L (ref 135–145)

## 2021-07-30 LAB — TROPONIN I (HIGH SENSITIVITY): Troponin I (High Sensitivity): 5 ng/L (ref <18)

## 2021-07-30 MED ORDER — OXYCODONE-ACETAMINOPHEN 5-325 MG PO TABS
1.0000 | ORAL_TABLET | Freq: Once | ORAL | Status: AC
  Administered 2021-07-30: 1 via ORAL
  Filled 2021-07-30: qty 1

## 2021-07-30 MED ORDER — ONDANSETRON 4 MG PO TBDP
8.0000 mg | ORAL_TABLET | Freq: Once | ORAL | Status: AC
  Administered 2021-07-30: 8 mg via ORAL
  Filled 2021-07-30: qty 2

## 2021-07-30 NOTE — ED Triage Notes (Signed)
Left shoulder pain radiating to her left upper arm for 2 weeks.  Denies any injuries.

## 2021-07-30 NOTE — ED Provider Notes (Signed)
[facility] EMERGENCY DEPT Provider Note   CSN: [id number] Arrival date & time: 07/30/21  1607     History Chief Complaint  Patient presents with   Shoulder Pain    Jessica White is a 60 y.o. female.   Shoulder Pain Associated symptoms: back pain   Associated symptoms: no fatigue and no fever    Patient presents with left shoulder and arm pain.  It started insidiously 2 weeks ago, the pain is constant.  She has tried Tylenol and sitting still to alleviate it.  It is aggravated with movement, but the pain is relatively constant and feels like numbness and tingling radiating from the shoulder down to her pinky.  Denies any chest pain or pressure, does endorse some back pain.  Denies any injury or trauma to the shoulder, no prior injuries or surgeries to the shoulder.  Patient does not use it actively, denies any overuse injuries at work.  Patient does not have any cardiac history, no history of MI or blood clots.    Past Medical History:  Diagnosis Date   Asthma     Patient Active Problem List   Diagnosis Date Noted   METATARSALGIA 11/27/2010    Past Surgical History:  Procedure Laterality Date   HERNIA REPAIR       OB History   No obstetric history on file.     No family history on file.  Social History   Tobacco Use   Smoking status: Never   Smokeless tobacco: Never  Vaping Use   Vaping Use: Never used  Substance Use Topics   Alcohol use: No   Drug use: No    Home Medications Prior to Admission medications   Not on File    Allergies    Patient has no known allergies.  Review of Systems   Review of Systems  Constitutional:  Negative for fatigue and fever.  Respiratory:  Negative for shortness of breath.   Cardiovascular:  Negative for chest pain and leg swelling.  Gastrointestinal:  Negative for abdominal pain, nausea and vomiting.  Musculoskeletal:  Positive for back pain and myalgias. Negative for arthralgias.  Neurological:   Negative for numbness and headaches.   Physical Exam Updated Vital Signs BP (!) 183/95   Pulse 95   Temp 98.2 F (36.8 C)   Resp 18   Ht 5\' 4"  (1.626 m)   Wt 99.8 kg   SpO2 96%   BMI 37.76 kg/m   Physical Exam Vitals and nursing note reviewed.  Constitutional:      General: She is not in acute distress.    Appearance: She is well-developed.     Comments: BMI 37.76  HENT:     Head: Normocephalic and atraumatic.  Eyes:     Conjunctiva/sclera: Conjunctivae normal.  Cardiovascular:     Rate and Rhythm: Normal rate and regular rhythm.     Pulses: Normal pulses.     Heart sounds: No murmur heard.    Comments: Radial pulse 2+ bilaterally Pulmonary:     Effort: Pulmonary effort is normal. No respiratory distress.     Breath sounds: Normal breath sounds.  Abdominal:     Palpations: Abdomen is soft.     Tenderness: There is no abdominal tenderness.  Musculoskeletal:        General: Tenderness present. Normal range of motion.     Cervical back: Neck supple.     Comments: Some tenderness to the posterior left shoulder around the scapula.  Full range  of motion, some subjective tenderness with exam.  Skin:    General: Skin is warm and dry.  Neurological:     Mental Status: She is alert.    ED Results / Procedures / Treatments   Labs (all labs ordered are listed, but only abnormal results are displayed) Labs Reviewed  BASIC METABOLIC PANEL  CBC  TROPONIN I (HIGH SENSITIVITY)    EKG None  Radiology No results found.  Procedures Procedures   Medications Ordered in ED Medications  oxyCODONE-acetaminophen (PERCOCET/ROXICET) 5-325 MG per tablet 1 tablet (has no administration in time range)  ondansetron (ZOFRAN-ODT) disintegrating tablet 8 mg (has no administration in time range)    ED Course  I have reviewed the triage vital signs and the nursing notes.  Pertinent labs & imaging results that were available during my care of the patient were reviewed by me and  considered in my medical decision making (see chart for details).    MDM Rules/Calculators/A&P                           She denies taking any medicine for high blood pressure, high cholesterol, diabetes.  She does not smoke cigarettes, overall low cardiac risk.  However, given the vague nature and onset of the left arm pain and the insidious nature I do think it is worthwhile to check an initial troponin to evaluate for ACS.  She has a risk factor of obesity and increased age, she also has high blood pressure in the ED setting and does not appear to get regular medical care.  I am concerned that the left arm pain could be an atypical presentation of ACS.  We will check an initial troponin, will also evaluate for musculoskeletal source of the shoulder pain with x-ray.  Chest pain work-up is reassuring.  Negative troponin, EKG has LVH but no acute ACS symptoms.  Patient does have an elevated creatinine and BUN, I discussed this with the patient advised her to follow-up with her PCP for recheck of lab values.  Radiograph of her shoulder does show a spur versus calcification.  Information orthopedic follow-up given.  Patient discharged in stable position.  Final Clinical Impression(s) / ED Diagnoses Final diagnoses:  None    Rx / DC Orders ED Discharge Orders     None        Theron Arista, Cordelia Poche 07/30/21 1914    Vanetta Mulders, MD 08/12/21 [phone]

## 2021-07-30 NOTE — Discharge Instructions (Signed)
Work-up for chest pain was negative.  He did have an elevation in creatinine which is a marker of kidney function.  I recommend following up with your primary care doctor about this and getting labs rechecked.  If you not have 1 information is provided above. The imaging of your shoulder did show a possible bony abnormality.  Take Tylenol ibuprofen as needed for it.  Follow-up with a primary care doctor or the orthopedic group as needed.  Return to the ED if things change or worsen.  Fragmented subacromial spur versus possible adjacent soft tissue  calcification, which can be seen with calcific tendinopathy.

## 2021-08-03 ENCOUNTER — Ambulatory Visit: Payer: Self-pay | Admitting: Family Medicine

## 2022-05-20 ENCOUNTER — Other Ambulatory Visit: Payer: Self-pay

## 2022-05-20 ENCOUNTER — Encounter (HOSPITAL_BASED_OUTPATIENT_CLINIC_OR_DEPARTMENT_OTHER): Payer: Self-pay

## 2022-05-20 ENCOUNTER — Emergency Department (HOSPITAL_BASED_OUTPATIENT_CLINIC_OR_DEPARTMENT_OTHER)
Admission: EM | Admit: 2022-05-20 | Discharge: 2022-05-20 | Disposition: A | Discharge status: Home / Self Care | Payer: Self-pay | Attending: Emergency Medicine | Admitting: Emergency Medicine

## 2022-05-20 DIAGNOSIS — J45909 Unspecified asthma, uncomplicated: Secondary | ICD-10-CM | POA: Insufficient documentation

## 2022-05-20 DIAGNOSIS — R600 Localized edema: Secondary | ICD-10-CM | POA: Insufficient documentation

## 2022-05-20 DIAGNOSIS — I10 Essential (primary) hypertension: Secondary | ICD-10-CM | POA: Insufficient documentation

## 2022-05-20 LAB — BASIC METABOLIC PANEL
Anion gap: 10 (ref 5–15)
BUN: 17 mg/dL (ref 8–23)
CO2: 26 mmol/L (ref 22–32)
Calcium: 9.7 mg/dL (ref 8.9–10.3)
Chloride: 106 mmol/L (ref 98–111)
Creatinine, Ser: 0.96 mg/dL (ref 0.44–1.00)
GFR, Estimated: >60 mL/min (ref >60)
Glucose, Bld: 109 mg/dL — ABNORMAL HIGH (ref 70–99)
Potassium: 4.2 mmol/L (ref 3.5–5.1)
Sodium: 142 mmol/L (ref 135–145)

## 2022-05-20 LAB — CBC WITH DIFFERENTIAL/PLATELET
Abs Immature Granulocytes: 0.04 10*3/uL (ref 0.00–0.07)
Basophils Absolute: 0 10*3/uL (ref 0.0–0.1)
Basophils Relative: 0 %
Eosinophils Absolute: 0.3 10*3/uL (ref 0.0–0.5)
Eosinophils Relative: 4 %
HCT: 38.8 % (ref 36.0–46.0)
Hemoglobin: 12.3 g/dL (ref 12.0–15.0)
Immature Granulocytes: 1 %
Lymphocytes Relative: 28 %
Lymphs Abs: 1.9 10*3/uL (ref 0.7–4.0)
MCH: 25.9 pg — ABNORMAL LOW (ref 26.0–34.0)
MCHC: 31.7 g/dL (ref 30.0–36.0)
MCV: 81.9 fL (ref 80.0–100.0)
Monocytes Absolute: 0.8 10*3/uL (ref 0.1–1.0)
Monocytes Relative: 12 %
Neutro Abs: 3.8 10*3/uL (ref 1.7–7.7)
Neutrophils Relative %: 55 %
Platelets: 254 10*3/uL (ref 150–400)
RBC: 4.74 MIL/uL (ref 3.87–5.11)
RDW: 15.6 % — ABNORMAL HIGH (ref 11.5–15.5)
WBC: 6.9 10*3/uL (ref 4.0–10.5)
nRBC: 0 % (ref 0.0–0.2)

## 2022-05-20 MED ORDER — AMLODIPINE BESYLATE 5 MG PO TABS: 5.0000 mg | ORAL_TABLET | Freq: Every day | ORAL | 1 refills | Status: DC

## 2022-05-20 MED ORDER — ACETAMINOPHEN 325 MG PO TABS
650.0000 mg | ORAL_TABLET | Freq: Once | ORAL | Status: AC
Start: 2022-05-20 — End: 2022-05-20
  Administered 2022-05-20: 650 mg via ORAL
  Filled 2022-05-20: qty 2

## 2022-05-20 MED ORDER — AMLODIPINE BESYLATE 5 MG PO TABS
5.0000 mg | ORAL_TABLET | Freq: Once | ORAL | Status: AC
Start: 2022-05-20 — End: 2022-05-20
  Administered 2022-05-20: 5 mg via ORAL
  Filled 2022-05-20: qty 1

## 2022-05-20 NOTE — ED Triage Notes (Signed)
Patient here POV from Home.  States she began to have a Headache at 1500 today. Friend assessed BP then and had Systolic readings above 194 Systolic.   No History of HTN. No Other Pain. No N/V/D.   NAD Noted during Triage. A&Ox4. GCS 15. Ambulatory.

## 2022-05-20 NOTE — ED Provider Notes (Signed)
[facility] EMERGENCY DEPT Provider Note  CSN: [id number] Arrival date & time: 05/20/22 1659  Chief Complaint(s) Hypertension  HPI Jessica White is a 61 y.o. female with history of asthma presenting to the emergency department with high blood pressure.  Patient reports that today she got a mild headache on the left side, her hairdresser suggested checking her blood pressure, it was elevated to 190 so she came to the emergency department.  She reports currently, her symptoms have improved.  She denies any nausea, vomiting, visual changes, numbness, tingling, weakness.  She denies any runny nose, sore throat, congestion.  Does not typically take blood pressure medications or check her home blood pressure.  Does not have a primary care physician.  Has noticed some mild leg swelling recently.  Denies any shortness of breath, chest pain, belly pain   Past Medical History Past Medical History:  Diagnosis Date   Asthma    Patient Active Problem List   Diagnosis Date Noted   METATARSALGIA 11/27/2010   Home Medication(s) Prior to Admission medications   Not on File                                                                                                                                    Past Surgical History Past Surgical History:  Procedure Laterality Date   HERNIA REPAIR     Family History No family history on file.  Social History Social History   Tobacco Use   Smoking status: Never   Smokeless tobacco: Never  Vaping Use   Vaping Use: Never used  Substance Use Topics   Alcohol use: No   Drug use: No   Allergies Patient has no known allergies.  Review of Systems Review of Systems  All other systems reviewed and are negative.   Physical Exam Vital Signs  I have reviewed the triage vital signs BP (!) 188/93   Pulse 74   Temp 98 F (36.7 C)   Resp 20   Ht 5\' 4"  (1.626 m)   Wt 99.8 kg   SpO2 100%   BMI 37.77 kg/m  Physical Exam Vitals and  nursing note reviewed.  Constitutional:      General: She is not in acute distress.    Appearance: She is well-developed.  HENT:     Head: Normocephalic and atraumatic.     Mouth/Throat:     Mouth: Mucous membranes are moist.  Eyes:     Pupils: Pupils are equal, round, and reactive to light.  Cardiovascular:     Rate and Rhythm: Normal rate and regular rhythm.     Heart sounds: No murmur heard. Pulmonary:     Effort: Pulmonary effort is normal. No respiratory distress.     Breath sounds: Normal breath sounds.  Abdominal:     General: Abdomen is flat.     Palpations: Abdomen is soft.     Tenderness: There is no  abdominal tenderness.  Musculoskeletal:        General: No tenderness.     Comments: Trace bilateral lower extremity edema  Skin:    General: Skin is warm and dry.  Neurological:     General: No focal deficit present.     Mental Status: She is alert. Mental status is at baseline.     Comments: Cranial nerves II through XII intact, strength 5 out of 5 in the bilateral upper and lower extremities, no sensory deficit to light touch, no dysmetria on finger-nose-finger testing, ambulatory with steady gait.   Psychiatric:        Mood and Affect: Mood normal.        Behavior: Behavior normal.     ED Results and Treatments Labs (all labs ordered are listed, but only abnormal results are displayed) Labs Reviewed  BASIC METABOLIC PANEL - Abnormal; Notable for the following components:      Result Value   Glucose, Bld 109 (*)    All other components within normal limits  CBC WITH DIFFERENTIAL/PLATELET - Abnormal; Notable for the following components:   MCH 25.9 (*)    RDW 15.6 (*)    All other components within normal limits                                                                                                                          Radiology No results found.  Pertinent labs & imaging results that were available during my care of the patient were reviewed  by me and considered in my medical decision making (see MDM for details).  Medications Ordered in ED Medications  acetaminophen (TYLENOL) tablet 650 mg (650 mg Oral Given 05/20/22 1847)  amLODipine (NORVASC) tablet 5 mg (5 mg Oral Given 05/20/22 1848)                                                                                                                                     Procedures Procedures  (including critical care time)  Medical Decision Making / ED Course   MDM:  61 year old female presenting to the emergency department with hypertension.  Overall well-appearing, exam unremarkable, neurologic exam reassuring.  Headache is significantly improved without intervention.  Given hypertension, will check basic labs as patient has had mild leg swelling, doubt hypertensive emergency, and no chest pain, vision changes, neurologic deficit, or other findings on exam.  We will treat hypertension with amlodipine.  Will reassess.  Clinical Course as of 05/20/22 1946  Sat May 20, 2022  1944 Labs reassuring. Headache resolved. Will start on amlodipine. Advised follow up with primary doctor. Will discharge patient to home. All questions answered. Patient comfortable with plan of discharge. Return precautions discussed with patient and specified on the after visit summary.  [WS]    Clinical Course User Index [WS] Lonell Grandchild, MD     Additional history obtained: -External records from outside source obtained and reviewed including: Chart review including previous notes, labs, imaging, consultation notes   Lab Tests: -I ordered, reviewed, and interpreted labs.   The pertinent results include:   Labs Reviewed  BASIC METABOLIC PANEL - Abnormal; Notable for the following components:      Result Value   Glucose, Bld 109 (*)    All other components within normal limits  CBC WITH DIFFERENTIAL/PLATELET - Abnormal; Notable for the following components:   MCH 25.9 (*)    RDW  15.6 (*)    All other components within normal limits      EKG   EKG Interpretation  Date/Time:  Saturday May 20 2022 17:32:41 EDT Ventricular Rate:  104 PR Interval:  146 QRS Duration: 82 QT Interval:  316 QTC Calculation: 415 R Axis:   8 Text Interpretation: Sinus tachycardia Possible Inferior infarct , age undetermined When compared with ECG of 30-Jul-2021 17:49, No significant change since last tracing Confirmed by Alvino Blood ([id number]) on 05/20/2022 7:46:21 PM         Medicines ordered and prescription drug management: Meds ordered this encounter  Medications   acetaminophen (TYLENOL) tablet 650 mg   amLODipine (NORVASC) tablet 5 mg    -I have reviewed the patients home medicines and have made adjustments as needed     Cardiac Monitoring: The patient was maintained on a cardiac monitor.  I personally viewed and interpreted the cardiac monitored which showed an underlying rhythm of: NSR    Reevaluation: After the interventions noted above, I reevaluated the patient and found that they have :resolved  Co morbidities that complicate the patient evaluation  Past Medical History:  Diagnosis Date   Asthma       Dispostion: Discharge     Final Clinical Impression(s) / ED Diagnoses Final diagnoses:  None     This chart was dictated using voice recognition software.  Despite best efforts to proofread,  errors can occur which can change the documentation meaning.    Lonell Grandchild, MD 05/20/22 1946

## 2022-05-26 ENCOUNTER — Ambulatory Visit
Admission: EM | Admit: 2022-05-26 | Discharge: 2022-05-26 | Disposition: A | Discharge status: Home / Self Care | Payer: Self-pay | Attending: Physician Assistant | Admitting: Physician Assistant

## 2022-05-26 DIAGNOSIS — R0981 Nasal congestion: Secondary | ICD-10-CM

## 2022-05-26 DIAGNOSIS — Z1152 Encounter for screening for COVID-19: Secondary | ICD-10-CM

## 2022-05-26 HISTORY — DX: Essential (primary) hypertension: I10

## 2022-05-26 MED ORDER — FLUTICASONE PROPIONATE 50 MCG/ACT NA SUSP: 1.0000 | Freq: Every day | NASAL | 0 refills | Status: AC

## 2022-05-26 NOTE — Discharge Instructions (Addendum)
  Please try Coricidin HBP for symptom relief. Nasal spray sent to pharmacy.   Follow up with any further concerns.

## 2022-05-26 NOTE — ED Provider Notes (Signed)
[facility]    CSN: [id number] Arrival date & time: 05/26/22  1431      History   Chief Complaint Chief Complaint  Patient presents with   Nasal Congestion    HPI Jessica White is a 61 y.o. female.   Patient here today for evaluation of nasal congestion that started this morning.  She reports she has had some associated cough as well.  She denies sore throat but has had some ear fullness.  She has not had fever.  She has taken over-the-counter medication without significant relief.  The history is provided by the patient.    Past Medical History:  Diagnosis Date   Asthma    Hypertension     Patient Active Problem List   Diagnosis Date Noted   METATARSALGIA 11/27/2010    Past Surgical History:  Procedure Laterality Date   HERNIA REPAIR      OB History   No obstetric history on file.      Home Medications    Prior to Admission medications   Medication Sig Start Date End Date Taking? Authorizing Provider  fluticasone (FLONASE) 50 MCG/ACT nasal spray Place 1 spray into both nostrils daily. 05/26/22  Yes Tomi Bamberger, PA-C  amLODipine (NORVASC) 5 MG tablet Take 1 tablet (5 mg total) by mouth daily. 05/20/22   Lonell Grandchild, MD    Family History History reviewed. No pertinent family history.  Social History Social History   Tobacco Use   Smoking status: Never   Smokeless tobacco: Never  Vaping Use   Vaping Use: Never used  Substance Use Topics   Alcohol use: No   Drug use: No     Allergies   Patient has no known allergies.   Review of Systems Review of Systems  Constitutional:  Negative for chills and fever.  HENT:  Positive for congestion. Negative for ear pain and sore throat.   Eyes:  Negative for discharge and redness.  Respiratory:  Positive for cough. Negative for shortness of breath and wheezing.   Gastrointestinal:  Negative for abdominal pain, diarrhea, nausea and vomiting.     Physical Exam Triage Vital  Signs ED Triage Vitals  Enc Vitals Group     BP 05/26/22 1556 (!) 143/103     Pulse Rate 05/26/22 1556 89     Resp 05/26/22 1556 16     Temp 05/26/22 1556 98.2 F (36.8 C)     Temp Source 05/26/22 1556 Oral     SpO2 05/26/22 1556 97 %     Weight --      Height --      Head Circumference --      Peak Flow --      Pain Score 05/26/22 1557 0     Pain Loc --      Pain Edu? --      Excl. in GC? --    No data found.  Updated Vital Signs BP (!) 143/103 (BP Location: Left Arm)   Pulse 89   Temp 98.2 F (36.8 C) (Oral)   Resp 16   SpO2 97%      Physical Exam Vitals and nursing note reviewed.  Constitutional:      General: She is not in acute distress.    Appearance: Normal appearance. She is not ill-appearing.  HENT:     Head: Normocephalic and atraumatic.     Right Ear: Tympanic membrane normal.     Left Ear: Tympanic membrane normal.  Nose: Congestion present.     Mouth/Throat:     Mouth: Mucous membranes are moist.     Pharynx: No oropharyngeal exudate or posterior oropharyngeal erythema.  Eyes:     Conjunctiva/sclera: Conjunctivae normal.  Cardiovascular:     Rate and Rhythm: Normal rate and regular rhythm.     Heart sounds: Normal heart sounds. No murmur heard. Pulmonary:     Effort: Pulmonary effort is normal. No respiratory distress.     Breath sounds: Normal breath sounds. No wheezing, rhonchi or rales.  Skin:    General: Skin is warm and dry.  Neurological:     Mental Status: She is alert.  Psychiatric:        Mood and Affect: Mood normal.        Thought Content: Thought content normal.      UC Treatments / Results  Labs (all labs ordered are listed, but only abnormal results are displayed) Labs Reviewed  SARS CORONAVIRUS 2 (TAT 6-24 HRS)    EKG   Radiology No results found.  Procedures Procedures (including critical care time)  Medications Ordered in UC Medications - No data to display  Initial Impression / Assessment and Plan / UC  Course  I have reviewed the triage vital signs and the nursing notes.  Pertinent labs & imaging results that were available during my care of the patient were reviewed by me and considered in my medical decision making (see chart for details).    Suspect viral etiology of symptoms and will screen for COVID.  Patient requested antibiotics to cover sinus infection but discussed with onset of symptoms today sinusitis unlikely.  Recommended follow-up if symptoms do not clear or worsen.  Recommended Coricidin HBP over-the-counter for treatment given known high blood pressure and Flonase prescribed.  Final Clinical Impressions(s) / UC Diagnoses   Final diagnoses:  Encounter for screening for COVID-19  Nasal congestion     Discharge Instructions       Please try Coricidin HBP for symptom relief. Nasal spray sent to pharmacy.   Follow up with any further concerns.      ED Prescriptions     Medication Sig Dispense Auth. Provider   fluticasone (FLONASE) 50 MCG/ACT nasal spray Place 1 spray into both nostrils daily. 15.8 mL Tomi Bamberger, PA-C      PDMP not reviewed this encounter.   Tomi Bamberger, PA-C 05/26/22 [phone]

## 2022-05-26 NOTE — ED Triage Notes (Signed)
Pt states nasal congestion since this morning.

## 2022-05-27 LAB — SARS CORONAVIRUS 2 (TAT 6-24 HRS): SARS Coronavirus 2: NEGATIVE

## 2023-01-07 ENCOUNTER — Other Ambulatory Visit: Payer: Self-pay

## 2023-01-07 ENCOUNTER — Encounter (HOSPITAL_BASED_OUTPATIENT_CLINIC_OR_DEPARTMENT_OTHER): Payer: Self-pay

## 2023-01-07 ENCOUNTER — Emergency Department (HOSPITAL_BASED_OUTPATIENT_CLINIC_OR_DEPARTMENT_OTHER)
Admission: EM | Admit: 2023-01-07 | Discharge: 2023-01-07 | Disposition: A | Discharge status: Home / Self Care | Payer: Self-pay | Attending: Emergency Medicine | Admitting: Emergency Medicine

## 2023-01-07 DIAGNOSIS — I10 Essential (primary) hypertension: Secondary | ICD-10-CM | POA: Insufficient documentation

## 2023-01-07 DIAGNOSIS — M25471 Effusion, right ankle: Secondary | ICD-10-CM | POA: Insufficient documentation

## 2023-01-07 LAB — BASIC METABOLIC PANEL
Anion gap: 12 (ref 5–15)
BUN: 15 mg/dL (ref 8–23)
CO2: 24 mmol/L (ref 22–32)
Calcium: 9.4 mg/dL (ref 8.9–10.3)
Chloride: 105 mmol/L (ref 98–111)
Creatinine, Ser: 1 mg/dL (ref 0.44–1.00)
GFR, Estimated: >60 mL/min (ref >60)
Glucose, Bld: 119 mg/dL — ABNORMAL HIGH (ref 70–99)
Potassium: 3.4 mmol/L — ABNORMAL LOW (ref 3.5–5.1)
Sodium: 141 mmol/L (ref 135–145)

## 2023-01-07 MED ORDER — AMLODIPINE BESYLATE 5 MG PO TABS
5.0000 mg | ORAL_TABLET | Freq: Once | ORAL | Status: AC
  Administered 2023-01-07: 5 mg via ORAL
  Filled 2023-01-07: qty 1

## 2023-01-07 MED ORDER — AMLODIPINE BESYLATE 5 MG PO TABS: 5.0000 mg | ORAL_TABLET | Freq: Every day | ORAL | 2 refills | Status: DC

## 2023-01-07 NOTE — ED Provider Notes (Signed)
[place] EMERGENCY DEPARTMENT AT [facility] Provider Note   CSN: [id number] Arrival date & time: 01/07/23  2013     History  Chief Complaint  Patient presents with   Hypertension    Jessica White is a 62 y.o. female.  Patient presents to the emergency department for evaluation of high blood pressure.  Patient states that yesterday she noted some swelling to the outside of her right ankle.  One of her friends told her that she should check her blood pressure and she found it to be elevated.  Patient has had a history of high blood pressure  and was and was prescribed amlodipine during emergency department visit last year, but she has failed to maintain compliance with the medication.  She denies associated chest pain or shortness of breath.  No new cough.  She denies injury of her ankle.  Otherwise she is feeling normal in her usual state of health.  No history of diabetes or high cholesterol.       Home Medications Prior to Admission medications   Medication Sig Start Date End Date Taking? Authorizing Provider  amLODipine (NORVASC) 5 MG tablet Take 1 tablet (5 mg total) by mouth daily. 05/20/22   Lonell Grandchild, MD  fluticasone (FLONASE) 50 MCG/ACT nasal spray Place 1 spray into both nostrils daily. 05/26/22   Tomi Bamberger, PA-C      Allergies    Patient has no known allergies.    Review of Systems   Review of Systems  Physical Exam Updated Vital Signs BP (!) 204/102 (BP Location: Right Arm)   Pulse (!) 102   Temp 98.7 F (37.1 C) (Oral)   Resp 18   Ht 5\' 5"  (1.651 m)   Wt 117.9 kg   SpO2 99%   BMI 43.27 kg/m   Physical Exam Vitals and nursing note reviewed.  Constitutional:      Appearance: She is well-developed.  HENT:     Head: Normocephalic and atraumatic.     Right Ear: External ear normal.     Left Ear: External ear normal.     Nose: Nose normal.  Eyes:     Conjunctiva/sclera: Conjunctivae normal.  Cardiovascular:     Rate and Rhythm:  Normal rate and regular rhythm.     Heart sounds: Murmur heard.  Pulmonary:     Effort: No respiratory distress.  Musculoskeletal:     Cervical back: Normal range of motion and neck supple.     Comments: Patient has some localized swelling over the lateral malleolus of the right ankle, but no significant calf tenderness or swelling bilaterally.  She is able to range her ankle well without pain.  No signs of cellulitis.  Skin:    General: Skin is warm and dry.  Neurological:     Mental Status: She is alert.     ED Results / Procedures / Treatments   Labs (all labs ordered are listed, but only abnormal results are displayed) Labs Reviewed  BASIC METABOLIC PANEL - Abnormal; Notable for the following components:      Result Value   Potassium 3.4 (*)    Glucose, Bld 119 (*)    All other components within normal limits    EKG None  Radiology No results found.  Procedures Procedures    Medications Ordered in ED Medications - No data to display  ED Course/ Medical Decision Making/ A&P    Patient seen and examined. History obtained directly from patient.  Will  check BMP and plan to initiate antihypertensives.  Labs/EKG: Ordered BMP.  Imaging: None ordered, consider chest x-ray with patient without significant signs of CHF.  Medications/Fluids: None ordered  Most recent vital signs reviewed and are as follows: BP (!) 204/102 (BP Location: Right Arm)   Pulse (!) 102   Temp 98.7 F (37.1 C) (Oral)   Resp 18   Ht  (1.651 m)   Wt 117.9 kg   SpO2 99%   BMI 43.27 kg/m   Initial impression: Hypertension, right ankle swelling.  9:56 PM Reassessment performed. Patient appears stable.  Last blood pressure was 199/81.  Patient received 5 mg amlodipine during reevaluation.  Labs personally reviewed and interpreted including: BMP with slightly elevated blood sugar and minimally low potassium at 3.4.  Reviewed pertinent lab work and imaging with patient at bedside.  Questions answered.   Most current vital signs reviewed and are as follows: BP (!) 199/81   Pulse 90   Temp 98.7 F (37.1 C) (Oral)   Resp 18   Ht  (1.651 m)   Wt 117.9 kg   SpO2 100%   BMI 43.27 kg/m   Plan: Discharge to home.   Prescriptions written for: Amlodipine  Other home care instructions discussed: Blood pressure monitoring, elevation of leg  ED return instructions discussed: Worsening or uncontrolled symptoms  Follow-up instructions discussed: Patient encouraged to follow-up with their PCP in 7 days.                                Medical Decision Making Amount and/or Complexity of Data Reviewed Labs: ordered.   Patient here with uncontrolled hypertension.  She has ankle swelling of undetermined etiology.  Do not suspect DVT, CHF, cellulitis, etc.  Likely unrelated to current hypertension.  Patient has had elevated blood pressures in the past.  She will be restarted today on amlodipine.  No signs of endorgan damage. She states that she does have PCP follow-up.  Counseled on importance of blood pressure control and likely need for medication adjustments and additions to get her blood pressure to goal, and that today's treatment is just a start of this.  The patient's vital signs, pertinent lab work and imaging were reviewed and interpreted as discussed in the ED course. Hospitalization was considered for further testing, treatments, or serial exams/observation. However as patient is well-appearing, has a stable exam, and reassuring studies today, I do not feel that they warrant admission at this time. This plan was discussed with the patient who verbalizes agreement and comfort with this plan and seems reliable and able to return to the Emergency Department with worsening or changing symptoms.          Final Clinical Impression(s) / ED Diagnoses Final diagnoses:  Uncontrolled hypertension    Rx / DC Orders ED Discharge Orders          Ordered     amLODipine (NORVASC) 5 MG tablet  Daily        01/07/23 2156              Renne Crigler, PA-C 01/07/23 2158    Glyn Ade, MD 01/07/23 [phone]

## 2023-01-07 NOTE — Discharge Instructions (Signed)
Please read and follow all provided instructions.  Your diagnoses today include:  1. Uncontrolled hypertension     Your blood pressure was high today (BP): BP (!) 199/81   Pulse 90   Temp 98.7 F (37.1 C) (Oral)   Resp 18   Ht 5\' 5"  (1.651 m)   Wt 117.9 kg   SpO2 100%   BMI 43.27 kg/m   Tests performed today include: Vital signs. See below for your results today.  Electrolytes and kidney function were normal  Medications prescribed:  Amlodipine: Oral medication for high blood pressure  Home care instructions:  Follow any educational materials contained in this packet.  Follow-up instructions: Please follow-up with your primary care provider in the next 7 days for a recheck of your symptoms and blood pressure.    Return instructions:  Please return to the Emergency Department if you experience worsening symptoms.  Return with severe chest pain, abdominal pain, or shortness of breath.  Return with severe headache, focal weakness, numbness, difficulty with speech or vision. Return with loss of vision or sudden vision change. Please return if you have any other emergent concerns.  Additional Information:  Your vital signs today were: BP (!) 199/81   Pulse 90   Temp 98.7 F (37.1 C) (Oral)   Resp 18   Ht 5\' 5"  (1.651 m)   Wt 117.9 kg   SpO2 100%   BMI 43.27 kg/m  If your blood pressure (BP) was elevated above 135/85 this visit, please have this repeated by your doctor within one month. --------------

## 2023-01-07 NOTE — ED Triage Notes (Signed)
Patient here POV from Home.  Endorses Right Knee and Ankle Swelling that began yesterday PM. Assessed BP and it was 232 Systolic.   No SOB or CP. No Headache. No Dizziness. Some Neck Discomfort. History of HTN and has not taken BP Medication in 2-3 Months. Informed then to take BP Medication or seek Assessment from PCP.   NAD Noted during Triage. A&Ox4. Gcs 15. Ambulatory.

## 2023-01-08 ENCOUNTER — Encounter: Payer: Self-pay | Admitting: *Deleted

## 2024-05-14 ENCOUNTER — Emergency Department (HOSPITAL_BASED_OUTPATIENT_CLINIC_OR_DEPARTMENT_OTHER)
Admission: EM | Admit: 2024-05-14 | Discharge: 2024-05-14 | Disposition: A | Discharge status: Home / Self Care | Payer: Self-pay

## 2024-05-14 ENCOUNTER — Other Ambulatory Visit: Payer: Self-pay

## 2024-05-14 DIAGNOSIS — J0100 Acute maxillary sinusitis, unspecified: Secondary | ICD-10-CM | POA: Insufficient documentation

## 2024-05-14 DIAGNOSIS — J45909 Unspecified asthma, uncomplicated: Secondary | ICD-10-CM | POA: Insufficient documentation

## 2024-05-14 DIAGNOSIS — Z79899 Other long term (current) drug therapy: Secondary | ICD-10-CM | POA: Insufficient documentation

## 2024-05-14 DIAGNOSIS — I10 Essential (primary) hypertension: Secondary | ICD-10-CM | POA: Insufficient documentation

## 2024-05-14 DIAGNOSIS — Z7951 Long term (current) use of inhaled steroids: Secondary | ICD-10-CM | POA: Insufficient documentation

## 2024-05-14 LAB — RESP PANEL BY RT-PCR (RSV, FLU A&B, COVID)  RVPGX2
Influenza A by PCR: NEGATIVE
Influenza B by PCR: NEGATIVE
Resp Syncytial Virus by PCR: NEGATIVE
SARS Coronavirus 2 by RT PCR: NEGATIVE

## 2024-05-14 MED ORDER — AMLODIPINE BESYLATE 5 MG PO TABS
5.0000 mg | ORAL_TABLET | Freq: Once | ORAL | Status: AC
  Administered 2024-05-14: 5 mg via ORAL
  Filled 2024-05-14: qty 1

## 2024-05-14 MED ORDER — AMOXICILLIN-POT CLAVULANATE 875-125 MG PO TABS
1.0000 | ORAL_TABLET | Freq: Two times a day (BID) | ORAL | 0 refills | Status: DC
Start: 2024-05-14 — End: 2024-05-14

## 2024-05-14 MED ORDER — OXYMETAZOLINE HCL 0.05 % NA SOLN: 1.0000 | Freq: Two times a day (BID) | NASAL | 0 refills | Status: AC

## 2024-05-14 MED ORDER — AMLODIPINE BESYLATE 5 MG PO TABS
5.0000 mg | ORAL_TABLET | Freq: Every day | ORAL | 0 refills | Status: AC
Start: 2024-05-14 — End: 2024-06-13

## 2024-05-14 MED ORDER — DIPHENHYDRAMINE HCL 25 MG PO CAPS
25.0000 mg | ORAL_CAPSULE | Freq: Once | ORAL | Status: AC
Start: 2024-05-14 — End: 2024-05-14
  Administered 2024-05-14: 25 mg via ORAL
  Filled 2024-05-14: qty 1

## 2024-05-14 MED ORDER — AMOXICILLIN-POT CLAVULANATE 875-125 MG PO TABS: 1.0000 | ORAL_TABLET | Freq: Two times a day (BID) | ORAL | 0 refills | Status: AC

## 2024-05-14 MED ORDER — ACETAMINOPHEN 500 MG PO TABS
1000.0000 mg | ORAL_TABLET | Freq: Once | ORAL | Status: AC
Start: 2024-05-14 — End: 2024-05-14
  Administered 2024-05-14: 1000 mg via ORAL
  Filled 2024-05-14: qty 2

## 2024-05-14 NOTE — ED Triage Notes (Signed)
 Reports sinus infection x 3 days. Denies fevers. Reports facial pain and watering eyes.

## 2024-05-14 NOTE — ED Provider Notes (Signed)
 [place] EMERGENCY DEPARTMENT AT [facility] Provider Note   CSN: [id number] Arrival date & time: 05/14/24  1308     Patient presents with: Facial Pain   Jessica White is a 63 y.o. female with PMHx asthma, HTN who presents to ED concerned for maxillofacial sinus pain x3 days with rhinorrhea and congestion as well. Patient had a dose of benadryl  yesterday, but has not taken any other OTC medications for her symptoms. Patient denies fever, sore throat, cough, nausea, vomiting, diarrhea. Patient denies sick contacts, but states that symptoms started after patient sat in a car without air conditioning a couple days ago. Patient states that she usually gets sinusitis twice a year when the seasons change.   Of note, patient also requesting for refill of BP medication. Patient has been out of this medication x2 weeks.   HPI     Prior to Admission medications   Medication Sig Start Date End Date Taking? Authorizing Provider  oxymetazoline  (AFRIN NASAL SPRAY) 0.05 % nasal spray Place 1 spray into both nostrils 2 (two) times daily. 05/14/24  Yes Hoy Fraction F, PA-C  amLODipine  (NORVASC ) 5 MG tablet Take 1 tablet (5 mg total) by mouth daily for 30 doses. 05/14/24 06/13/24  Hoy Fraction FALCON, PA-C  amoxicillin -clavulanate (AUGMENTIN ) 875-125 MG tablet Take 1 tablet by mouth every 12 (twelve) hours for 7 days. 05/16/24 05/23/24  Hoy Fraction FALCON, PA-C  fluticasone  (FLONASE ) 50 MCG/ACT nasal spray Place 1 spray into both nostrils daily. 05/26/22   Billy Asberry FALCON, PA-C    Allergies: Patient has no known allergies.    Review of Systems  HENT:  Positive for sinus pain.     Updated Vital Signs BP (!) 166/144 (BP Location: Right Arm)   Pulse (!) 106   Temp 99.6 F (37.6 C) (Oral)   Resp 20   SpO2 96%   Physical Exam Vitals and nursing note reviewed.  Constitutional:      General: She is not in acute distress.    Appearance: She is not ill-appearing or toxic-appearing.   HENT:     Head: Normocephalic and atraumatic.     Nose:     Comments: BL maxillary sinus tenderness to palpation    Mouth/Throat:     Mouth: Mucous membranes are moist.  Eyes:     General: No scleral icterus.       Right eye: No discharge.        Left eye: No discharge.     Conjunctiva/sclera: Conjunctivae normal.  Cardiovascular:     Rate and Rhythm: Normal rate and regular rhythm.     Pulses: Normal pulses.     Heart sounds: Normal heart sounds. No murmur heard. Pulmonary:     Effort: Pulmonary effort is normal. No respiratory distress.     Breath sounds: Normal breath sounds. No wheezing, rhonchi or rales.  Abdominal:     General: Abdomen is flat.  Musculoskeletal:     Left lower leg: Left lower leg edema: .sfmur.  Skin:    General: Skin is warm and dry.     Findings: No rash.  Neurological:     General: No focal deficit present.     Mental Status: She is alert and oriented to person, place, and time. Mental status is at baseline.  Psychiatric:        Mood and Affect: Mood normal.        Behavior: Behavior normal.     (all labs ordered are listed, but only  abnormal results are displayed) Labs Reviewed  RESP PANEL BY RT-PCR (RSV, FLU A&B, COVID)  RVPGX2    EKG: None  Radiology: No results found.   Procedures   Medications Ordered in the ED  acetaminophen  (TYLENOL ) tablet 1,000 mg (1,000 mg Oral Given 05/14/24 1513)  diphenhydrAMINE  (BENADRYL ) capsule 25 mg (25 mg Oral Given 05/14/24 1513)  amLODipine  (NORVASC ) tablet 5 mg (5 mg Oral Given 05/14/24 1513)                                    Medical Decision Making   This patient presents to the ED for concern of sinus pain, this involves an extensive number of treatment options, and is a complaint that carries with it a high risk of complications and morbidity.  The differential diagnosis includes Flu/COVID/RSV, sinusitis, pneumonia, meningitis.   Co morbidities that complicate the patient  evaluation  Asthma, HTN   Problem List / ED Course / Critical interventions / Medication management  Patient presents to ED concerned for BL maxillary sinus pain, congestion, and rhinorrhea.  Exam with BL maxillary sinus tenderness to palpation.  Rest of physical exam reassuring.  Patient afebrile with stable vitals. I Ordered, and personally interpreted labs.  Respiratory panel negative. All results with patient.  Answered all questions.  Patient was educated on alternating between 650 mg Tylenol  and 400 mg ibuprofen every 3 hours as needed for pain.  I will provide patient with Afrin and she understands to not use this medication for more than 3 days. I will prescribe patient ABX for if her sinus pain does not resolve by day 7. I also will prescribe patient her hypertensive medicines.  Patient understands the need to follow-up with her PCP. I have reviewed the patients home medicines and have made adjustments as needed The patient has been appropriately medically screened and/or stabilized in the ED. I have low suspicion for any other emergent medical condition which would require further screening, evaluation or treatment in the ED or require inpatient management. At time of discharge the patient is hemodynamically stable and in no acute distress. I have discussed work-up results and diagnosis with patient and answered all questions. Patient is agreeable with discharge plan. We discussed strict return precautions for returning to the emergency department and they verbalized understanding.      Social Determinants of Health:  none      Final diagnoses:  Acute non-recurrent maxillary sinusitis  Primary hypertension    ED Discharge Orders          Ordered    amoxicillin -clavulanate (AUGMENTIN ) 875-125 MG tablet  Every 12 hours        05/14/24 1512    amLODipine  (NORVASC ) 5 MG tablet  Daily        05/14/24 1510    oxymetazoline  (AFRIN NASAL SPRAY) 0.05 % nasal spray  2 times daily         05/14/24 1510    amoxicillin -clavulanate (AUGMENTIN ) 875-125 MG tablet  Every 12 hours,   Status:  Discontinued        05/14/24 1511               Hoy Nidia FALCON, [place] 05/14/24 1514    Ula Prentice SAUNDERS, MD 05/15/24 [phone]

## 2024-05-14 NOTE — Discharge Instructions (Addendum)
 As discussed, you will need to follow-up with your primary care provider.  Seek emergency care if experiencing any new or worsening symptoms.  Alternating between 650 mg Tylenol  and 400 mg Advil : The best way to alternate taking Acetaminophen  (example Tylenol ) and Ibuprofen  (example Advil /Motrin ) is to take them 3 hours apart. For example, if you take ibuprofen  at 6 am you can then take Tylenol  at 9 am. You can continue this regimen throughout the day, making sure you do not exceed the recommended maximum dose for each drug.

## 2024-06-12 ENCOUNTER — Other Ambulatory Visit: Payer: Self-pay

## 2024-06-12 ENCOUNTER — Emergency Department (HOSPITAL_BASED_OUTPATIENT_CLINIC_OR_DEPARTMENT_OTHER)
Admission: EM | Admit: 2024-06-12 | Discharge: 2024-06-12 | Disposition: A | Discharge status: Home / Self Care | Payer: Self-pay

## 2024-06-12 ENCOUNTER — Emergency Department (HOSPITAL_BASED_OUTPATIENT_CLINIC_OR_DEPARTMENT_OTHER): Payer: Self-pay | Admitting: Radiology

## 2024-06-12 DIAGNOSIS — J45909 Unspecified asthma, uncomplicated: Secondary | ICD-10-CM | POA: Insufficient documentation

## 2024-06-12 DIAGNOSIS — R0981 Nasal congestion: Secondary | ICD-10-CM | POA: Insufficient documentation

## 2024-06-12 DIAGNOSIS — R051 Acute cough: Secondary | ICD-10-CM | POA: Insufficient documentation

## 2024-06-12 LAB — CBC
HCT: 40.3 % (ref 36.0–46.0)
Hemoglobin: 12.9 g/dL (ref 12.0–15.0)
MCH: 26.2 pg (ref 26.0–34.0)
MCHC: 32 g/dL (ref 30.0–36.0)
MCV: 81.9 fL (ref 80.0–100.0)
Platelets: 301 K/uL (ref 150–400)
RBC: 4.92 MIL/uL (ref 3.87–5.11)
RDW: 14.6 % (ref 11.5–15.5)
WBC: 10.1 K/uL (ref 4.0–10.5)
nRBC: 0 % (ref 0.0–0.2)

## 2024-06-12 LAB — BASIC METABOLIC PANEL WITH GFR
Anion gap: 13 (ref 5–15)
BUN: 15 mg/dL (ref 8–23)
CO2: 23 mmol/L (ref 22–32)
Calcium: 9.5 mg/dL (ref 8.9–10.3)
Chloride: 105 mmol/L (ref 98–111)
Creatinine, Ser: 0.98 mg/dL (ref 0.44–1.00)
GFR, Estimated: >60 mL/min (ref >60)
Glucose, Bld: 135 mg/dL — ABNORMAL HIGH (ref 70–99)
Potassium: 4 mmol/L (ref 3.5–5.1)
Sodium: 141 mmol/L (ref 135–145)

## 2024-06-12 LAB — RESP PANEL BY RT-PCR (RSV, FLU A&B, COVID)  RVPGX2
Influenza A by PCR: NEGATIVE
Influenza B by PCR: NEGATIVE
Resp Syncytial Virus by PCR: NEGATIVE
SARS Coronavirus 2 by RT PCR: NEGATIVE

## 2024-06-12 MED ORDER — AMOXICILLIN-POT CLAVULANATE 875-125 MG PO TABS: 1.0000 | ORAL_TABLET | Freq: Two times a day (BID) | ORAL | 0 refills | Status: AC

## 2024-06-12 MED ORDER — IPRATROPIUM-ALBUTEROL 0.5-2.5 (3) MG/3ML IN SOLN: 3.0000 mL | Freq: Once | RESPIRATORY_TRACT | Status: DC

## 2024-06-12 MED ORDER — GUAIFENESIN 100 MG/5ML PO LIQD: 100.0000 mg | ORAL | 0 refills | Status: AC | PRN

## 2024-06-12 MED ORDER — AMOXICILLIN-POT CLAVULANATE 875-125 MG PO TABS: 1.0000 | ORAL_TABLET | Freq: Two times a day (BID) | ORAL | 0 refills | Status: DC

## 2024-06-12 MED ORDER — ALBUTEROL SULFATE HFA 108 (90 BASE) MCG/ACT IN AERS: 1.0000 | INHALATION_SPRAY | Freq: Four times a day (QID) | RESPIRATORY_TRACT | 0 refills | Status: AC | PRN

## 2024-06-12 NOTE — Discharge Instructions (Signed)
 As we discussed, your chest x-ray was normal.  No signs of pneumonia which is great news.  I will give you Augmentin  for your sinus infection.  I also prescribed you some cough medicine and albuterol  inhaler.  I would like for you to return to the emergency department for any worsening symptoms.  You can follow-up with your primary doctor.

## 2024-06-12 NOTE — ED Triage Notes (Signed)
 Pt POV reporting asthma exacerbation related to sinus infection, completed course of abx. Does not have inhaler or neb treatments at home, labored in triage.

## 2024-06-12 NOTE — ED Provider Notes (Signed)
 [place] EMERGENCY DEPARTMENT AT [facility] Provider Note   CSN: [id number] Arrival date & time: 06/12/24  1554     Patient presents with: Shortness of Breath   Jessica White is a 63 y.o. female with history of asthma who presents to the emergency department today for further evaluation of nasal congestion, sore throat, and productive cough.  She states that she is coughing up yellow-green sputum.  She denies any fever or chills.  Patient states that she is having a harder time with her asthma.  Does not have any nebulizer treatments or inhalers at home.  Denies any abdominal symptoms, nausea, vomiting, diarrhea.    Shortness of Breath      Prior to Admission medications   Medication Sig Start Date End Date Taking? Authorizing Provider  albuterol  (VENTOLIN  HFA) 108 (90 Base) MCG/ACT inhaler Inhale 1-2 puffs into the lungs every 6 (six) hours as needed for wheezing or shortness of breath. 06/12/24  Yes Theotis, Haylo Fake M, PA-C  guaiFENesin  (ROBITUSSIN) 100 MG/5ML liquid Take 5-10 mLs (100-200 mg total) by mouth every 4 (four) hours as needed for cough or to loosen phlegm. 06/12/24  Yes Nevayah Faust M, PA-C  amLODipine  (NORVASC ) 5 MG tablet Take 1 tablet (5 mg total) by mouth daily for 30 doses. 05/14/24 06/13/24  Hoy Nidia FALCON, PA-C  amoxicillin -clavulanate (AUGMENTIN ) 875-125 MG tablet Take 1 tablet by mouth every 12 (twelve) hours. 06/12/24   Theotis Peers M, PA-C  fluticasone  (FLONASE ) 50 MCG/ACT nasal spray Place 1 spray into both nostrils daily. 05/26/22   Billy Asberry FALCON, PA-C  oxymetazoline  (AFRIN NASAL SPRAY) 0.05 % nasal spray Place 1 spray into both nostrils 2 (two) times daily. 05/14/24   Hoy Nidia FALCON, PA-C    Allergies: Patient has no known allergies.    Review of Systems  Respiratory:  Positive for shortness of breath.   All other systems reviewed and are negative.   Updated Vital Signs BP (!) 147/115   Pulse 98   Temp 98.2 F (36.8 C) (Oral)    Resp 15   Ht 5' 4 (1.626 m)   Wt 117.9 kg   SpO2 100%   BMI 44.63 kg/m   Physical Exam Vitals and nursing note reviewed.  Constitutional:      General: She is not in acute distress.    Appearance: Normal appearance.  HENT:     Head: Normocephalic and atraumatic.  Eyes:     General:        Right eye: No discharge.        Left eye: No discharge.  Cardiovascular:     Comments: Regular rate and rhythm.  S1/S2 are distinct without any evidence of murmur, rubs, or gallops.  Radial pulses are 2+ bilaterally.  Dorsalis pedis pulses are 2+ bilaterally.  No evidence of pedal edema. Pulmonary:     Comments: Clear to auscultation bilaterally.  Normal effort.  No respiratory distress.  No evidence of wheezes, rales, or rhonchi heard throughout. Abdominal:     General: Abdomen is flat. Bowel sounds are normal. There is no distension.     Tenderness: There is no abdominal tenderness. There is no guarding or rebound.  Musculoskeletal:        General: Normal range of motion.     Cervical back: Neck supple.  Skin:    General: Skin is warm and dry.     Findings: No rash.  Neurological:     General: No focal deficit present.  Mental Status: She is alert.  Psychiatric:        Mood and Affect: Mood normal.        Behavior: Behavior normal.     (all labs ordered are listed, but only abnormal results are displayed) Labs Reviewed  BASIC METABOLIC PANEL WITH GFR - Abnormal; Notable for the following components:      Result Value   Glucose, Bld 135 (*)    All other components within normal limits  RESP PANEL BY RT-PCR (RSV, FLU A&B, COVID)  RVPGX2  CBC    EKG: EKG Interpretation Date/Time:  Thursday June 12 2024 16:02:32 EDT Ventricular Rate:  111 PR Interval:  138 QRS Duration:  85 QT Interval:  324 QTC Calculation: 441 R Axis:   4  Text Interpretation: Sinus tachycardia Confirmed by Gennaro Bouchard ([id number]) on 06/12/2024 4:09:43 PM  Radiology: [facility] Chest 2 View Result  Date: 06/12/2024 EXAM: 2 VIEW(S) XRAY OF THE CHEST 06/12/2024 04:42:00 PM COMPARISON: 07/30/2021 CLINICAL HISTORY: SOB. Pt POV reporting asthma exacerbation related to sinus infection, completed course of abx. Does not have inhaler or neb treatments at home, labored in triage. FINDINGS: LUNGS AND PLEURA: Lung volumes are low. No focal pulmonary opacity. No pulmonary edema. No pleural effusion. No pneumothorax. HEART AND MEDIASTINUM: No acute abnormality of the cardiac and mediastinal silhouettes. BONES AND SOFT TISSUES: No acute osseous abnormality. IMPRESSION: 1. No acute process. 2. Low lung volumes. Electronically signed by: Waddell Calk MD 06/12/2024 04:49 PM EDT RP Workstation: [id number]    Procedures   Medications Ordered in the ED - No data to display   Medical Decision Making Jessica White is a 63 y.o. female patient who presents to the emergency department today for further evaluation of nasal congestion and cough.  Patient was briefly tachycardic here.  That is resolved.  Chest x-ray was normal.  No signs of pneumonia.  COVID swab and respiratory panel was negative.  Blood work was okay.  Patient states she gets recurrent sinusitis and gets antibiotics for these.  Will give her a prescription for Augmentin .  Will also refill her albuterol  inhaler and give her some Robitussin for her cough.  Strict return precautions were discussed.  She is safe for discharge.   Amount and/or Complexity of Data Reviewed Labs: ordered. Radiology: ordered.  Risk OTC drugs. Prescription drug management.     Final diagnoses:  Nasal congestion  Acute cough    ED Discharge Orders          Ordered    albuterol  (VENTOLIN  HFA) 108 (90 Base) MCG/ACT inhaler  Every 6 hours PRN        06/12/24 1812    amoxicillin -clavulanate (AUGMENTIN ) 875-125 MG tablet  Every 12 hours,   Status:  Discontinued        06/12/24 1812    guaiFENesin  (ROBITUSSIN) 100 MG/5ML liquid  Every 4 hours PRN        06/12/24 1812     amoxicillin -clavulanate (AUGMENTIN ) 875-125 MG tablet  Every 12 hours        06/12/24 1813               Theotis Cameron CHRISTELLA, PA-C 06/12/24 1815    Kammerer, Megan L, DO 06/22/24 [phone]

## 2024-07-16 DIAGNOSIS — I10 Essential (primary) hypertension: Secondary | ICD-10-CM | POA: Diagnosis not present

## 2024-07-16 DIAGNOSIS — L659 Nonscarring hair loss, unspecified: Secondary | ICD-10-CM | POA: Diagnosis not present

## 2024-07-16 DIAGNOSIS — Z13220 Encounter for screening for lipoid disorders: Secondary | ICD-10-CM | POA: Diagnosis not present

## 2024-07-16 DIAGNOSIS — R748 Abnormal levels of other serum enzymes: Secondary | ICD-10-CM | POA: Diagnosis not present

## 2024-07-16 DIAGNOSIS — Z7689 Persons encountering health services in other specified circumstances: Secondary | ICD-10-CM | POA: Diagnosis not present

## 2024-07-16 DIAGNOSIS — M25561 Pain in right knee: Secondary | ICD-10-CM | POA: Diagnosis not present

## 2024-07-16 DIAGNOSIS — Z1321 Encounter for screening for nutritional disorder: Secondary | ICD-10-CM | POA: Diagnosis not present

## 2024-07-16 DIAGNOSIS — G8929 Other chronic pain: Secondary | ICD-10-CM | POA: Diagnosis not present

## 2024-07-16 DIAGNOSIS — R7303 Prediabetes: Secondary | ICD-10-CM | POA: Diagnosis not present

## 2024-07-16 DIAGNOSIS — M19012 Primary osteoarthritis, left shoulder: Secondary | ICD-10-CM | POA: Diagnosis not present

## 2024-07-16 DIAGNOSIS — M25511 Pain in right shoulder: Secondary | ICD-10-CM | POA: Diagnosis not present

## 2024-07-16 DIAGNOSIS — R195 Other fecal abnormalities: Secondary | ICD-10-CM | POA: Diagnosis not present

## 2024-07-16 DIAGNOSIS — E66813 Obesity, class 3: Secondary | ICD-10-CM | POA: Diagnosis not present

## 2024-07-16 DIAGNOSIS — Z79899 Other long term (current) drug therapy: Secondary | ICD-10-CM | POA: Diagnosis not present

## 2024-07-16 DIAGNOSIS — Z6841 Body Mass Index (BMI) 40.0 and over, adult: Secondary | ICD-10-CM | POA: Diagnosis not present
